# Patient Record
Sex: Male | Born: 1985 | Race: White | Hispanic: No | Marital: Married | Smoking: Former smoker
Health system: Southern US, Community
[De-identification: ages and names within clinical notes are randomized; demographics above are authoritative.]

## PROBLEM LIST (undated history)

## (undated) DIAGNOSIS — T8859XA Other complications of anesthesia, initial encounter: Secondary | ICD-10-CM

## (undated) DIAGNOSIS — K317 Polyp of stomach and duodenum: Secondary | ICD-10-CM

## (undated) DIAGNOSIS — K219 Gastro-esophageal reflux disease without esophagitis: Secondary | ICD-10-CM

## (undated) DIAGNOSIS — R519 Headache, unspecified: Secondary | ICD-10-CM

## (undated) DIAGNOSIS — D135 Benign neoplasm of extrahepatic bile ducts: Secondary | ICD-10-CM

## (undated) DIAGNOSIS — D4819 Other specified neoplasm of uncertain behavior of connective and other soft tissue: Secondary | ICD-10-CM

## (undated) DIAGNOSIS — D48118 Desmoid tumor of other site: Secondary | ICD-10-CM

## (undated) DIAGNOSIS — D481 Neoplasm of uncertain behavior of connective and other soft tissue: Secondary | ICD-10-CM

## (undated) DIAGNOSIS — R51 Headache: Secondary | ICD-10-CM

## (undated) DIAGNOSIS — Q8789 Other specified congenital malformation syndromes, not elsewhere classified: Secondary | ICD-10-CM

## (undated) DIAGNOSIS — D126 Benign neoplasm of colon, unspecified: Secondary | ICD-10-CM

## (undated) HISTORY — DX: Polyp of stomach and duodenum: K31.7

## (undated) HISTORY — DX: Benign neoplasm of extrahepatic bile ducts: D13.5

## (undated) HISTORY — PX: UPPER GASTROINTESTINAL ENDOSCOPY: SHX188

## (undated) HISTORY — PX: APPENDECTOMY: SHX54

## (undated) HISTORY — PX: COLECTOMY: SHX59

## (undated) HISTORY — DX: Neoplasm of uncertain behavior of connective and other soft tissue: D48.1

## (undated) HISTORY — DX: Headache: R51

## (undated) HISTORY — DX: Desmoid tumor of other site: D48.118

## (undated) HISTORY — DX: Headache, unspecified: R51.9

## (undated) HISTORY — DX: Other specified neoplasm of uncertain behavior of connective and other soft tissue: D48.19

## (undated) HISTORY — DX: Gastro-esophageal reflux disease without esophagitis: K21.9

## (undated) HISTORY — DX: Other specified congenital malformation syndromes, not elsewhere classified: Q87.89

## (undated) HISTORY — PX: COLONOSCOPY: SHX174

## (undated) HISTORY — DX: Benign neoplasm of colon, unspecified: D12.6

---

## 2014-08-18 ENCOUNTER — Encounter: Payer: Self-pay | Admitting: Family Medicine

## 2014-08-18 ENCOUNTER — Ambulatory Visit (INDEPENDENT_AMBULATORY_CARE_PROVIDER_SITE_OTHER): Payer: Managed Care, Other (non HMO) | Admitting: Family Medicine

## 2014-08-18 VITALS — BP 100/74 | HR 97 | Temp 98.3°F | Ht 75.25 in | Wt 264.3 lb

## 2014-08-18 DIAGNOSIS — F4541 Pain disorder exclusively related to psychological factors: Secondary | ICD-10-CM

## 2014-08-18 DIAGNOSIS — Q8789 Other specified congenital malformation syndromes, not elsewhere classified: Secondary | ICD-10-CM

## 2014-08-18 DIAGNOSIS — Z7689 Persons encountering health services in other specified circumstances: Secondary | ICD-10-CM

## 2014-08-18 DIAGNOSIS — Z7189 Other specified counseling: Secondary | ICD-10-CM

## 2014-08-18 DIAGNOSIS — F411 Generalized anxiety disorder: Secondary | ICD-10-CM

## 2014-08-18 DIAGNOSIS — Z23 Encounter for immunization: Secondary | ICD-10-CM

## 2014-08-18 DIAGNOSIS — D126 Benign neoplasm of colon, unspecified: Secondary | ICD-10-CM

## 2014-08-18 NOTE — Addendum Note (Signed)
Addended by: Agnes Lawrence on: 08/18/2014 05:10 PM   Modules accepted: Orders

## 2014-08-18 NOTE — Patient Instructions (Signed)
BEFORE YOU LEAVE: -schedule morning physical/follow up in 2-3 months - come fasting, drink plenty of water  -We placed a referral for you as discussed to the gastroenterologist. It usually takes about 1-2 weeks to process and schedule this referral. If you have not heard from [place] regarding this appointment in 2 weeks please contact our office.  We recommend the following healthy lifestyle measures: - eat a healthy diet consisting of lots of vegetables, fruits, beans, nuts, seeds, healthy meats such as white chicken and fish and whole grains.  - avoid fried foods, fast food, processed foods, sodas, red meet and other fattening foods.  - get a least 150 minutes of aerobic exercise per week.   Call to set up counseling

## 2014-08-18 NOTE — Progress Notes (Signed)
Pre visit review using our clinic review tool, if applicable. No additional management support is needed unless otherwise documented below in the visit note. 

## 2014-08-18 NOTE — Progress Notes (Signed)
HPI:  Jonathan Marsh is here to establish care. Wife sees me and advised he come in.  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 Gardner syndrome     Past Surgical History  Procedure Laterality Date  . Colectomy      age 29 due to cancerous polyps    Family History  Problem Relation Age of Onset  . Cancer Mother     GI cancer  . Drug abuse Mother   . Cancer Maternal Grandmother     lung  . Cancer Maternal Grandfather     colon ca    History   Social History  . Marital Status: Married    Spouse Name: N/A    Number of Children: N/A  . Years of Education: N/A   Social History Main Topics  . Smoking status: Former Research scientist (life sciences)  . Smokeless tobacco: None  . Alcohol Use: No  . Drug Use: None  . Sexual Activity: None   Other Topics Concern  . 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  EXAM:  Filed Vitals:   08/18/14 1620  BP: 100/74  Pulse: 97  Temp: 98.3 F (36.8 C)    Body mass index is 32.83 kg/(m^2).  GENERAL: vitals reviewed and listed above, alert, oriented, appears well hydrated and in no acute distress  HEENT: atraumatic, conjunttiva clear, no obvious abnormalities on inspection of external nose and ears  NECK: no obvious masses on inspection  LUNGS: clear to auscultation bilaterally, no wheezes, rales or rhonchi, good air movement  CV: HRRR, no peripheral edema  MS: moves all extremities without noticeable abnormality  PSYCH: pleasant and cooperative, no obvious depression or anxiety  NEURO: CN II-XII grossly intact, finger to nose normal, gait normal  ASSESSMENT AND PLAN:  Discussed the following assessment and plan:  Gardner's syndrome - Plan: Ambulatory referral to Gastroenterology -referral to GI, advised of importance of following up for this  GAD (generalized anxiety disorder) -discussed treatment options - meds, counseling, exercise -he opted to start with counseling and exercise and follow up in 2-3 months  Stress headaches -sounds most likely related to stress -advised should get labs and consider imaging  - he opted to hold of on imaging for now, do labs  at follow up and get imaging if HAs persist  Encounter to establish care -We reviewed the PMH, PSH, FH, SH, Meds and Allergies. -We provided refills for any medications we will prescribe as needed. -We addressed current concerns per orders and patient instructions. -We have asked for records for pertinent exams, studies, vaccines and notes from previous providers. -We have advised patient to follow up per instructions below.   -Patient advised to return or notify a doctor immediately if symptoms worsen or persist or new concerns arise.  Patient  Instructions  BEFORE YOU LEAVE: -schedule morning physical/follow up in 2-3 months - come fasting, drink plenty of water  -We placed a referral for you as discussed to the gastroenterologist. It usually takes about 1-2 weeks to process and schedule this referral. If you have not heard from [place] regarding this appointment in 2 weeks please contact our office.  We recommend the following healthy lifestyle measures: - eat a healthy diet consisting of lots of vegetables, fruits, beans, nuts, seeds, healthy meats such as white chicken and fish and whole grains.  - avoid fried foods, fast food, processed foods, sodas, red meet and other fattening foods.  - get a least 150 minutes of aerobic exercise per week.   Call to set up counseling      Rilei Kravitz, [facility] R.

## 2014-08-24 ENCOUNTER — Encounter: Payer: Self-pay | Admitting: Internal Medicine

## 2014-10-13 ENCOUNTER — Ambulatory Visit (INDEPENDENT_AMBULATORY_CARE_PROVIDER_SITE_OTHER): Payer: Managed Care, Other (non HMO) | Admitting: Internal Medicine

## 2014-10-13 ENCOUNTER — Encounter: Payer: Self-pay | Admitting: Internal Medicine

## 2014-10-13 VITALS — BP 120/66 | HR 68 | Ht 79.0 in | Wt 270.2 lb

## 2014-10-13 DIAGNOSIS — R634 Abnormal weight loss: Secondary | ICD-10-CM

## 2014-10-13 DIAGNOSIS — Q8789 Other specified congenital malformation syndromes, not elsewhere classified: Secondary | ICD-10-CM

## 2014-10-13 DIAGNOSIS — K219 Gastro-esophageal reflux disease without esophagitis: Secondary | ICD-10-CM

## 2014-10-13 DIAGNOSIS — D126 Benign neoplasm of colon, unspecified: Secondary | ICD-10-CM

## 2014-10-13 MED ORDER — RANITIDINE HCL 150 MG PO TABS: 150.0000 mg | ORAL_TABLET | Freq: Two times a day (BID) | ORAL | Status: DC

## 2014-10-13 NOTE — Patient Instructions (Signed)
We have sent the following medications to your pharmacy for you to pick up at your convenience:  Zantac  You have been scheduled for an endoscopy. Please follow written instructions given to you at your visit today. If you use inhalers (even only as needed), please bring them with you on the day of your procedure.

## 2014-10-13 NOTE — Progress Notes (Addendum)
HISTORY OF PRESENT ILLNESS:  Jonathan Marsh is a 29 y.o. male , originally from [place], who presents today to establish as a new patient because of a personal history of Gardner's syndrome. The patient reports that his paternal grandfather died of colon cancer at age 31. Subsequently, his mother was diagnosed with Gardner's syndrome. The patient was found to have Gardner's as well and underwent total colectomy at age 50, for multiple polyposis, at the [facility]. He denies being known to have any extraintestinal manifestations of the condition. He tells me that he last underwent upper endoscopy in [place] approximately 4 years ago and was found to have "benign polyps". He has had no evaluation since. His GI review of systems is remarkable for chronic and significant heartburn without dysphagia. He was advised not to use PPI. He is on no therapy. He also reports stress associated with decreased appetite and weight loss. He does have a chronic tendency toward loose bowels which is unchanged. No other GI complaints.  REVIEW OF SYSTEMS:  All non-GI ROS negative except for  Past Medical History  Diagnosis Date  . Gardner syndrome   . Headache     mild - stress related  . GERD (gastroesophageal reflux disease)     Past Surgical History  Procedure Laterality Date  . Colectomy      age 85 due to cancerous polyps  . Colonoscopy    . Upper gastrointestinal endoscopy      Social History Loretta Kluender  reports that he has quit smoking. He does not have any smokeless tobacco history on file. He reports that he does not drink alcohol. His drug history is not on file.  family history includes Cancer in his maternal grandfather, maternal grandmother, and mother; Drug abuse in his mother.  No Known Allergies     PHYSICAL EXAMINATION: Vital signs: BP 120/66 mmHg  Pulse 68  Ht 6\' 7"  (2.007 m)  Wt 270 lb 3.2 oz (122.562 kg)  BMI 30.43 kg/m2  Constitutional: generally well-appearing, no acute  distress Psychiatric: alert and oriented x3, cooperative Eyes: extraocular movements intact, anicteric, conjunctiva pink Mouth: oral pharynx moist, no lesions Neck: supple no lymphadenopathy. No obvious thyroid abnormality Cardiovascular: heart regular rate and rhythm, no murmur Lungs: clear to auscultation bilaterally Abdomen: soft, nontender, nondistended, no obvious ascites, no peritoneal signs, normal bowel sounds, no organomegaly. Prior surgical incision well-healed Rectal: Omitted Extremities: no lower extremity edema bilaterally Skin: no lesions on visible extremities Neuro: No focal deficits.   ASSESSMENT:  #1. Gardner syndrome status post total abdominal colectomy 25 years ago. I informed him that he is officially my first patient with the syndrome. In conjunction with his primary care physician, screening strategies as outlined below obtained from "up-to-date". #2. Chronic GERD. Significant symptoms. Untreated. #3. Decreased appetite and mild weight loss. Likely secondary to stress. Rule out organic process   PLAN:  #1. EGD with attention to the ampulla Vater. #2. Contrast-enhanced CT scan of the abdomen and pelvis #3. Primary care physician to screen for thyroid disease #4. Routine GI follow-up annually #5. Reflux precautions next #6. Prescribe and ranitidine 150 mg twice a day  SCREENING FOR EXTRAINTESTINAL MALIGNANCIES AND DESMOIDS - Consensus has not been achieved on optimal strategies for screening patients with Gardner syndrome (GS) for extraintestinal malignancies and desmoids. However, general screening recommendations for extracolonic malignancies have been suggested for patients with familial adenomatous polyposis (FAP) and should be applied to those with GS. (See "Familial adenomatous polyposis: Screening and management of patients  and families".) In addition: ?The thyroid should probably be subject to physical examination and ultrasound annually, starting at age  57 to 65 years. ?It is generally accepted that other possible extraintestinal cancer sites should be evaluated only if symptoms occur or if these cancers have occurred in relatives. This recommendation is provisional and not accepted by all investigators in this area. ?Patients should be questioned regularly regarding neurologic symptoms. In addition, a periodic head MRI is recommended if any family member has had CNS malignancy. The appropriate frequency is not known, but intervals of one to three years have been discussed, depending on the family history and presence of any possible related symptoms. ?Every six month liver palpation, together with liver function tests, abdominal ultrasound exam, and alpha fetoprotein levels, should be performed to screen for hepatoblastoma, especially during the first five years of life. This can be safely discontinued after age 57, and probably after age 61. The risk of occurrence of this tumor in neonates and children provides a rationale for performing genetic testing in the first year of life to determine which persons should have the screening. Although formal recommendations have not been given in this regard, genetic testing may be useful in patients if liver screening is to be considered. ?Biliary evaluation should be performed only for symptoms or abnormal liver function tests. Upper endoscopy every one to three years, with side viewing capability is indicated for screening the stomach and duodenum (as discussed in the section on FAP). Obvious papillary abnormalities will be observed with this screening. We perform annual liver function tests to screen for biliary tumors, although the sensitivity of this approach is unknown. Screening is not routinely undertaken for desmoids, but evaluation should be pursued for palpable masses or symptoms. On the other hand, it is reasonable to obtain an abdominal computed tomography (CT) with oral contrast every three years  beginning at age 25 to 7 [70]. Particular attention should also be given to the biliary tree, gallbladder, pancreas, small bowel and adrenal glands.  A copy of this consultation note will be sent to the patient's primary care provider, Dr. Maudie Mercury

## 2014-10-13 NOTE — Addendum Note (Signed)
Addended by: Agnes Lawrence on: 10/13/2014 02:15 PM   Modules accepted: Orders

## 2014-10-22 ENCOUNTER — Encounter: Payer: Managed Care, Other (non HMO) | Admitting: Family Medicine

## 2014-10-22 ENCOUNTER — Ambulatory Visit (AMBULATORY_SURGERY_CENTER): Payer: Managed Care, Other (non HMO) | Admitting: Internal Medicine

## 2014-10-22 ENCOUNTER — Encounter: Payer: Self-pay | Admitting: Internal Medicine

## 2014-10-22 VITALS — BP 109/53 | HR 54 | Temp 97.5°F | Resp 13 | Ht 79.0 in | Wt 270.0 lb

## 2014-10-22 DIAGNOSIS — K317 Polyp of stomach and duodenum: Secondary | ICD-10-CM

## 2014-10-22 DIAGNOSIS — D131 Benign neoplasm of stomach: Secondary | ICD-10-CM | POA: Diagnosis not present

## 2014-10-22 DIAGNOSIS — D135 Benign neoplasm of extrahepatic bile ducts: Secondary | ICD-10-CM | POA: Diagnosis not present

## 2014-10-22 DIAGNOSIS — K219 Gastro-esophageal reflux disease without esophagitis: Secondary | ICD-10-CM

## 2014-10-22 MED ORDER — SODIUM CHLORIDE 0.9 % IV SOLN: 500.0000 mL | INTRAVENOUS | Status: DC

## 2014-10-22 NOTE — Op Note (Signed)
[facility]  [address] [place], [address]   ENDOSCOPY PROCEDURE REPORT  PATIENT: Semir, Brill  MR#: [id number] BIRTHDATE: 01-25-1986 , 28  yrs. old GENDER: male ENDOSCOPIST: Eustace Quail, MD REFERRED BY:  Colin Benton, D.O. PROCEDURE DATE:  10/22/2014 PROCEDURE:  EGD w/ biopsy ASA CLASS:     Class II INDICATIONS:  screening for gastric / ampullary adenomatous. History of Gardner syndrome. MEDICATIONS: Monitored anesthesia care, Propofol 200 mg IV, and Lidocaine 200 mg IV TOPICAL ANESTHETIC: none DESCRIPTION OF PROCEDURE: After the risks benefits and alternatives of the procedure were thoroughly explained, informed consent was obtained.  The LB [id number] endoscope was introduced through the mouth and advanced to the second portion of the duodenum , Without limitations.  The instrument was slowly withdrawn as the mucosa was fully examined.  The esophagus was normal.  The stomach revealed innumerable small (5 mm or less) polyps in the proximal half.  Two 4 mm polyps, slightly red in the proximal antrum were biopsied.  No other gastric abnormalities.  The duodenal bulb and post bulbar duodenum were normal.  The major ampulla was slightly prominent, but likely within normal limits.  However biopsies taken to be certain. Retroflexed views revealed no abnormalities.     The scope was then withdrawn from the patient and the procedure completed. COMPLICATIONS: There were no immediate complications.  ENDOSCOPIC IMPRESSION: 1. Gastric polyps as described 2. Ampullary biopsies taken  RECOMMENDATIONS: 1.  Await biopsy results. Dr. Henrene Pastor Will send you a letter in a few weeks with the results 2.  My office will arrange for you to have a contrast-enhanced CT scan of the abdomen and pelvis "Gardner syndrome, surveillance for malignancy" 3.  Endoscopy  to be repeated in 2 years if biopsies negative  REPEAT EXAM:  eSigned:  Eustace Quail, MD 10/22/2014  12:14 PM    BB:[id number] Maudie Mercury, MD and The Patient

## 2014-10-22 NOTE — Progress Notes (Signed)
Called to room to assist during endoscopic procedure.  Patient ID and intended procedure confirmed with present staff. Received instructions for my participation in the procedure from the performing physician.  

## 2014-10-22 NOTE — Progress Notes (Signed)
Report to PACU, RN, vss, BBS= Clear.  

## 2014-10-22 NOTE — Patient Instructions (Signed)
YOU HAD AN ENDOSCOPIC PROCEDURE TODAY AT THE [facility]:   Refer to the procedure report that was given to you for any specific questions about what was found during the examination.  If the procedure report does not answer your questions, please call your gastroenterologist to clarify.  If you requested that your care partner not be given the details of your procedure findings, then the procedure report has been included in a sealed envelope for you to review at your convenience later.  YOU SHOULD EXPECT: Some feelings of bloating in the abdomen. Passage of more gas than usual.  Walking can help get rid of the air that was put into your GI tract during the procedure and reduce the bloating. If you had a lower endoscopy (such as a colonoscopy or flexible sigmoidoscopy) you may notice spotting of blood in your stool or on the toilet paper. If you underwent a bowel prep for your procedure, you may not have a normal bowel movement for a few days.  Please Note:  You might notice some irritation and congestion in your nose or some drainage.  This is from the oxygen used during your procedure.  There is no need for concern and it should clear up in a day or so.  SYMPTOMS TO REPORT IMMEDIATELY:    Following upper endoscopy (EGD)  Vomiting of blood or coffee ground material  New chest pain or pain under the shoulder blades  Painful or persistently difficult swallowing  New shortness of breath  Fever of 100F or higher  Black, tarry-looking stools  For urgent or emergent issues, a gastroenterologist can be reached at any hour by calling [phone].   DIET: Your first meal following the procedure should be a small meal and then it is ok to progress to your normal diet. Heavy or fried foods are harder to digest and may make you feel nauseous or bloated.  Likewise, meals heavy in dairy and vegetables can increase bloating.  Drink plenty of fluids but you should avoid alcoholic beverages  for 24 hours.  ACTIVITY:  You should plan to take it easy for the rest of today and you should NOT DRIVE or use heavy machinery until tomorrow (because of the sedation medicines used during the test).    FOLLOW UP: Our staff will call the number listed on your records the next business day following your procedure to check on you and address any questions or concerns that you may have regarding the information given to you following your procedure. If we do not reach you, we will leave a message.  However, if you are feeling well and you are not experiencing any problems, there is no need to return our call.  We will assume that you have returned to your regular daily activities without incident.  If any biopsies were taken you will be contacted by phone or by letter within the next 1-3 weeks.  Please call us at [phone] if you have not heard about the biopsies in 3 weeks.    SIGNATURES/CONFIDENTIALITY: You and/or your care partner have signed paperwork which will be entered into your electronic medical record.  These signatures attest to the fact that that the information above on your After Visit Summary has been reviewed and is understood.  Full responsibility of the confidentiality of this discharge information lies with you and/or your care-partner. 

## 2014-10-23 ENCOUNTER — Telehealth: Payer: Self-pay

## 2014-10-23 ENCOUNTER — Other Ambulatory Visit: Payer: Self-pay

## 2014-10-23 ENCOUNTER — Telehealth: Payer: Self-pay | Admitting: *Deleted

## 2014-10-23 DIAGNOSIS — D126 Benign neoplasm of colon, unspecified: Principal | ICD-10-CM

## 2014-10-23 DIAGNOSIS — Q8789 Other specified congenital malformation syndromes, not elsewhere classified: Secondary | ICD-10-CM

## 2014-10-23 NOTE — Telephone Encounter (Signed)
Pt scheduled for CT of A/P with contrast at [facility] CT 10/28/14@3pm . Pt to be NPO after 11am except bottle 1 of contrast at 1pm, second bottle at 2pm. Left message for pt to call back.

## 2014-10-23 NOTE — Telephone Encounter (Signed)
Spoke with pt and he is aware of appt. Pt given the number [phone] to call and reschedule the appt if this date does not work for him.

## 2014-10-23 NOTE — Telephone Encounter (Signed)
  Follow up Call-  Call back number 10/22/2014  Post procedure Call Back phone  # [phone]  Permission to leave phone message Yes     Patient questions:  Do you have a fever, pain , or abdominal swelling? No. Pain Score  0 *  Have you tolerated food without any problems? Yes.    Have you been able to return to your normal activities? No.  Do you have any questions about your discharge instructions: Diet   No. Medications  No. Follow up visit  No.  Do you have questions or concerns about your Care? No.  Actions: * If pain score is 4 or above: No action needed, pain <4.

## 2014-10-27 ENCOUNTER — Other Ambulatory Visit: Payer: Managed Care, Other (non HMO)

## 2014-10-28 ENCOUNTER — Ambulatory Visit (INDEPENDENT_AMBULATORY_CARE_PROVIDER_SITE_OTHER)
Admission: RE | Admit: 2014-10-28 | Discharge: 2014-10-28 | Disposition: A | Discharge status: Home / Self Care | Payer: Managed Care, Other (non HMO) | Source: Ambulatory Visit | Attending: Internal Medicine | Admitting: Internal Medicine

## 2014-10-28 DIAGNOSIS — D126 Benign neoplasm of colon, unspecified: Secondary | ICD-10-CM

## 2014-10-28 DIAGNOSIS — Q8789 Other specified congenital malformation syndromes, not elsewhere classified: Secondary | ICD-10-CM

## 2014-10-28 MED ORDER — IOHEXOL 300 MG/ML  SOLN
100.0000 mL | Freq: Once | INTRAMUSCULAR | Status: AC | PRN
  Administered 2014-10-28: 100 mL via INTRAVENOUS

## 2014-11-02 ENCOUNTER — Encounter: Payer: Managed Care, Other (non HMO) | Admitting: Family Medicine

## 2014-11-10 ENCOUNTER — Encounter: Payer: Self-pay | Admitting: Family Medicine

## 2014-11-10 ENCOUNTER — Ambulatory Visit (INDEPENDENT_AMBULATORY_CARE_PROVIDER_SITE_OTHER): Payer: Managed Care, Other (non HMO) | Admitting: Family Medicine

## 2014-11-10 VITALS — BP 102/66 | HR 84 | Temp 98.4°F | Ht 76.5 in | Wt 271.9 lb

## 2014-11-10 DIAGNOSIS — Z0000 Encounter for general adult medical examination without abnormal findings: Secondary | ICD-10-CM

## 2014-11-10 DIAGNOSIS — D126 Benign neoplasm of colon, unspecified: Secondary | ICD-10-CM | POA: Diagnosis not present

## 2014-11-10 DIAGNOSIS — F411 Generalized anxiety disorder: Secondary | ICD-10-CM

## 2014-11-10 DIAGNOSIS — Q8789 Other specified congenital malformation syndromes, not elsewhere classified: Secondary | ICD-10-CM

## 2014-11-10 LAB — COMPREHENSIVE METABOLIC PANEL
ALBUMIN: 4.4 g/dL (ref 3.5–5.2)
ALK PHOS: 59 U/L (ref 39–117)
ALT: 33 U/L (ref 0–53)
AST: 22 U/L (ref 0–37)
BILIRUBIN TOTAL: 0.7 mg/dL (ref 0.2–1.2)
BUN: 12 mg/dL (ref 6–23)
CALCIUM: 9.7 mg/dL (ref 8.4–10.5)
CO2: 29 mEq/L (ref 19–32)
Chloride: 105 mEq/L (ref 96–112)
Creatinine, Ser: 1.09 mg/dL (ref 0.40–1.50)
GFR: 84.72 mL/min (ref >60.00)
Glucose, Bld: 91 mg/dL (ref 70–99)
POTASSIUM: 4.3 meq/L (ref 3.5–5.1)
Sodium: 139 mEq/L (ref 135–145)
Total Protein: 7.2 g/dL (ref 6.0–8.3)

## 2014-11-10 LAB — HEMOGLOBIN A1C: Hgb A1c MFr Bld: 5.2 % (ref 4.6–6.5)

## 2014-11-10 LAB — LIPID PANEL
CHOLESTEROL: 204 mg/dL — AB (ref 0–200)
HDL: 49.2 mg/dL (ref >39.00)
LDL Cholesterol: 133 mg/dL — ABNORMAL HIGH (ref 0–99)
NonHDL: 154.8
Total CHOL/HDL Ratio: 4
Triglycerides: 108 mg/dL (ref 0.0–149.0)
VLDL: 21.6 mg/dL (ref 0.0–40.0)

## 2014-11-10 NOTE — Progress Notes (Signed)
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 Henrene Pastor here - appreciate care; pt reports Dr. Henrene Pastor is working in consultation with a specialist in [facility] -no [place] CNS malignancy on mother's side -denies: any new HA - reports his headaches have resolved for the most part and he is not interested in doing an MRI, vision changes, gait abnormalities, fever, sig changes in weight that are unintentional, masses -he canceled his [place] of his neck - he did not know why he needed this  Stress: -reports this has improved -working of a balance -denies: SI, thoughts of self harm, worsening or frequent has, cog changes  -Diet: reports diet is poor - trying to watch out for "really bad food" the last few weeks  -Exercise: has started to jog/run twice per week with his step son  -Diabetes and Dyslipidemia Screening: FASTING today for labs  -Hx of HTN: no  -Vaccines: UTD per his report  -sexual activity: yes, male partner, no new partners  -wants STI testing, Hep C screening (if born 14-1966): opted to do HIV, but not concerned  -Alcohol, Tobacco, drug use: see social history  Review of Systems - no fevers, unintentional weight loss, vision loss, hearing loss, chest pain, sob, hemoptysis, melena, hematochezia, hematuria, genital discharge, changing or concerning skin lesions, bleeding, bruising, loc, thoughts of self harm or SI  Past Medical History  Diagnosis Date  . Gardner syndrome   . Headache     mild - stress related  . GERD (gastroesophageal reflux disease)     Past Surgical History  Procedure Laterality Date  . Colectomy      age 29 due to cancerous polyps  . Colonoscopy    . Upper gastrointestinal endoscopy      Family History  Problem Relation Age of Onset  . Cancer Mother     GI cancer  . Drug abuse Mother   . Cancer Maternal Grandmother     lung  . Cancer Maternal Grandfather     colon ca    History    Social History  . Marital Status: Married    Spouse Name: N/A  . Number of Children: N/A  . Years of Education: N/A   Social History Main Topics  . Smoking status: Former Research scientist (life sciences)  . Smokeless tobacco: Not on file  . Alcohol Use: No  . Drug Use: Not on file  . Sexual Activity: Not on file   Other Topics Concern  . 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  ranitidine (ZANTAC) 150 MG tablet, Take 1 tablet (150 mg total) by mouth 2 (two) times daily., Disp: 60 tablet, Rfl: 6  EXAM:  Filed Vitals:   11/10/14 0953  BP: 102/66  Pulse: 84  Temp: 98.4 F (36.9 C)  TempSrc: Oral  Height: 6' 4.5" (1.943 m)  Weight: 271 lb 14.4 oz (123.333 kg)    Estimated body mass index is 32.67 kg/(m^2) as calculated from the following:   Height as of this encounter: 6' 4.5" (1.943 m).   Weight as of this encounter: 271 lb 14.4 oz (123.333 kg).  GENERAL: vitals reviewed  and listed below, alert, oriented, appears well hydrated and in no acute distress  HEENT: head atraumatic, PERRLA, normal appearance of eyes, ears, nose and mouth. moist mucus membranes.  NECK: supple, no masses or lymphadenopathy  LUNGS: clear to auscultation bilaterally, no rales, rhonchi or wheeze  CV: HRRR, no peripheral edema or cyanosis, normal pedal pulses  ABDOMEN: bowel sounds normal, soft, non tender to palpation, no masses, no rebound or guarding  GU: normal appearance of external genitalia - no lesions or masses, hernia exam normal.  RECTAL: refused  SKIN: no rash or abnormal lesions  MS: normal gait, moves all extremities normally  NEURO: CN II-XII grossly intact, normal muscle strength and sensation to light touch on extremities  PSYCH: normal affect, pleasant and cooperative  ASSESSMENT  AND PLAN:  Discussed the following assessment and plan:  Visit for preventive health examination - Plan: US Soft Tissue Head/Neck, CMP, Lipid Panel, Hemoglobin A1c, HIV antibody (with reflex)  Gardner's syndrome  GAD (generalized anxiety disorder)  -Discussed and advised all [place] preventive services health task force level A and B recommendations for age, sex and risks.  -Advised at least 150 minutes of exercise per week and a healthy diet low in saturated fats and sweets and consisting of fresh fruits and vegetables, lean meats such as fish and white chicken and whole grains.  -FASTING labs, studies and vaccines per orders this encounter  -Thyroid US for Gardner's - he cancelled this, advised to reschedule and reordered  -his HAs have sig improved, no other neurologically complaints, advised of increased risks of CNS malignancy and advised MRI - he declined this for now, advised if headaches recur should definitively pursue this  Patient advised to return to clinic immediately if symptoms worsen or persist or new concerns.  There are no Patient Instructions on file for this visit.  Return in about 6 months (around 05/13/2015), or if symptoms worsen or fail to improve, for follow up.   Colin Benton R.

## 2014-11-10 NOTE — Progress Notes (Signed)
Pre visit review using our clinic review tool, if applicable. No additional management support is needed unless otherwise documented below in the visit note. 

## 2014-11-10 NOTE — Patient Instructions (Signed)
BEFORE YOU LEAVE: -labs  Please get the [place] of your neck that we re-ordered for you - call our office if any difficulty scheduling this.  Follow up in 6 months and as needed and any time you have new or changing symptoms or concerns regarding your health  We recommend the following healthy lifestyle measures: - eat a healthy diet consisting of lots of vegetables, fruits, beans, nuts, seeds, healthy meats such as white chicken and fish and whole grains.  - avoid fried foods, fast food, processed foods, sodas, red meet and other fattening foods.  - get a least 150 minutes of aerobic exercise per week.

## 2014-11-11 ENCOUNTER — Encounter: Payer: Self-pay | Admitting: *Deleted

## 2014-11-11 LAB — HIV ANTIBODY (ROUTINE TESTING W REFLEX): HIV 1&2 Ab, 4th Generation: NONREACTIVE

## 2014-11-11 NOTE — Progress Notes (Signed)
I spoke with patient about results, but will send letter at a later date. Thank you for asking and following up. Dr. Henrene Pastor     ----- Message -----     From: Levonne Spiller, RN     Sent: 11/11/2014  7:25 AM      To: Irene Shipper, MD        Do you want patient to have result letter? Any recall EGD? Thanks, [employer]

## 2014-11-23 ENCOUNTER — Other Ambulatory Visit: Payer: Managed Care, Other (non HMO)

## 2014-11-30 ENCOUNTER — Other Ambulatory Visit: Payer: Managed Care, Other (non HMO)

## 2014-12-07 ENCOUNTER — Other Ambulatory Visit: Payer: Managed Care, Other (non HMO)

## 2014-12-14 ENCOUNTER — Other Ambulatory Visit: Payer: Managed Care, Other (non HMO)

## 2014-12-24 ENCOUNTER — Other Ambulatory Visit: Payer: Managed Care, Other (non HMO)

## 2014-12-29 ENCOUNTER — Other Ambulatory Visit: Payer: Managed Care, Other (non HMO)

## 2015-01-04 ENCOUNTER — Other Ambulatory Visit: Payer: Managed Care, Other (non HMO)

## 2015-03-03 ENCOUNTER — Telehealth: Payer: Self-pay

## 2015-03-03 ENCOUNTER — Other Ambulatory Visit: Payer: Self-pay

## 2015-03-03 DIAGNOSIS — D369 Benign neoplasm, unspecified site: Secondary | ICD-10-CM

## 2015-03-03 NOTE — Telephone Encounter (Signed)
-----   Message from Irene Shipper, MD sent at 03/02/2015 12:11 PM EDT ----- Regarding: Evaluate ampulla with side-viewing scope Vaughan Basta,  Please contact the patient. I have discussed his case with colleagues. At this time I would like to reevaluate his ampulla with adenoma using a side-viewing (ERCP) scope. This will help determine the next step moving forward. This will need to be scheduled at Hospital (we do not have that special scope in the [facility]). I have a morning block on Tuesday, August 16, if that works for him. Thanks Dr. Henrene Pastor

## 2015-03-03 NOTE — Telephone Encounter (Signed)
Pt scheduled for EGD with ERCP scope at [facility] 03/30/15@11 :30am, pt to arrive there at 10am. Pt to be NPO after midnight. Pt aware of appt.

## 2015-03-25 ENCOUNTER — Encounter (HOSPITAL_COMMUNITY): Payer: Self-pay | Admitting: *Deleted

## 2015-03-30 ENCOUNTER — Encounter (HOSPITAL_COMMUNITY): Payer: Self-pay

## 2015-03-30 ENCOUNTER — Ambulatory Visit (HOSPITAL_COMMUNITY): Payer: Managed Care, Other (non HMO) | Admitting: Anesthesiology

## 2015-03-30 ENCOUNTER — Encounter (HOSPITAL_COMMUNITY)
Admission: RE | Disposition: A | Discharge status: Home / Self Care | Payer: Self-pay | Source: Ambulatory Visit | Attending: Internal Medicine

## 2015-03-30 ENCOUNTER — Ambulatory Visit (HOSPITAL_COMMUNITY)
Admission: RE | Admit: 2015-03-30 | Discharge: 2015-03-30 | Disposition: A | Discharge status: Home / Self Care | Payer: Managed Care, Other (non HMO) | Source: Ambulatory Visit | Attending: Internal Medicine | Admitting: Internal Medicine

## 2015-03-30 DIAGNOSIS — K317 Polyp of stomach and duodenum: Secondary | ICD-10-CM | POA: Diagnosis not present

## 2015-03-30 DIAGNOSIS — Z8719 Personal history of other diseases of the digestive system: Secondary | ICD-10-CM | POA: Insufficient documentation

## 2015-03-30 DIAGNOSIS — Z9049 Acquired absence of other specified parts of digestive tract: Secondary | ICD-10-CM | POA: Insufficient documentation

## 2015-03-30 DIAGNOSIS — Z87891 Personal history of nicotine dependence: Secondary | ICD-10-CM | POA: Insufficient documentation

## 2015-03-30 DIAGNOSIS — Z809 Family history of malignant neoplasm, unspecified: Secondary | ICD-10-CM | POA: Diagnosis not present

## 2015-03-30 DIAGNOSIS — D369 Benign neoplasm, unspecified site: Secondary | ICD-10-CM

## 2015-03-30 DIAGNOSIS — C241 Malignant neoplasm of ampulla of Vater: Secondary | ICD-10-CM | POA: Diagnosis not present

## 2015-03-30 DIAGNOSIS — K219 Gastro-esophageal reflux disease without esophagitis: Secondary | ICD-10-CM | POA: Insufficient documentation

## 2015-03-30 DIAGNOSIS — Z86010 Personal history of colonic polyps: Secondary | ICD-10-CM | POA: Diagnosis present

## 2015-03-30 DIAGNOSIS — D135 Benign neoplasm of extrahepatic bile ducts: Secondary | ICD-10-CM

## 2015-03-30 HISTORY — PX: ESOPHAGOGASTRODUODENOSCOPY: SHX5428

## 2015-03-30 SURGERY — EGD (ESOPHAGOGASTRODUODENOSCOPY)
Anesthesia: Monitor Anesthesia Care

## 2015-03-30 MED ORDER — ONDANSETRON HCL 4 MG/2ML IJ SOLN
INTRAMUSCULAR | Status: DC | PRN
  Administered 2015-03-30: 4 mg via INTRAVENOUS

## 2015-03-30 MED ORDER — PROPOFOL 10 MG/ML IV BOLUS
INTRAVENOUS | Status: AC
  Filled 2015-03-30: qty 20

## 2015-03-30 MED ORDER — BUTAMBEN-TETRACAINE-BENZOCAINE 2-2-14 % EX AERO
INHALATION_SPRAY | CUTANEOUS | Status: DC | PRN
  Administered 2015-03-30: 2 via TOPICAL

## 2015-03-30 MED ORDER — LACTATED RINGERS IV SOLN
INTRAVENOUS | Status: DC
  Administered 2015-03-30: 1000 mL via INTRAVENOUS

## 2015-03-30 MED ORDER — SODIUM CHLORIDE 0.9 % IV SOLN: INTRAVENOUS | Status: DC

## 2015-03-30 MED ORDER — LIDOCAINE HCL (CARDIAC) 20 MG/ML IV SOLN
INTRAVENOUS | Status: AC
  Filled 2015-03-30: qty 5

## 2015-03-30 MED ORDER — LIDOCAINE HCL (CARDIAC) 20 MG/ML IV SOLN
INTRAVENOUS | Status: DC | PRN
  Administered 2015-03-30: 100 mg via INTRAVENOUS

## 2015-03-30 MED ORDER — ONDANSETRON HCL 4 MG/2ML IJ SOLN
INTRAMUSCULAR | Status: AC
  Filled 2015-03-30: qty 2

## 2015-03-30 MED ORDER — PROPOFOL 10 MG/ML IV BOLUS
INTRAVENOUS | Status: DC | PRN
  Administered 2015-03-30 (×6): 50 mg via INTRAVENOUS

## 2015-03-30 NOTE — Transfer of Care (Signed)
Immediate Anesthesia Transfer of Care Note  Patient: Jonathan Marsh  Procedure(s) Performed: Procedure(s) with comments: ESOPHAGOGASTRODUODENOSCOPY (EGD) (N/A) - need side viewing scope  Patient Location: PACU  Anesthesia Type:MAC  Level of Consciousness: sedated  Airway & Oxygen Therapy: Patient Spontanous Breathing and Patient connected to nasal cannula oxygen  Post-op Assessment: Report given to RN and Post -op Vital signs reviewed and stable  Post vital signs: Reviewed and stable  Last Vitals:  Filed Vitals:   03/30/15 1046  BP: 113/79  Pulse: 69  Temp: 36.7 C  Resp: 15    Complications: No apparent anesthesia complications

## 2015-03-30 NOTE — Anesthesia Preprocedure Evaluation (Signed)
Anesthesia Evaluation  Patient identified by MRN, date of birth, ID band Patient awake    Reviewed: Allergy & Precautions, NPO status , Patient's Chart, lab work & pertinent test results  Airway Mallampati: I  TM Distance: >3 FB Neck ROM: Full    Dental no notable dental hx.    Pulmonary neg pulmonary ROS, former smoker,  breath sounds clear to auscultation  Pulmonary exam normal       Cardiovascular negative cardio ROS Normal cardiovascular examRhythm:Regular Rate:Normal     Neuro/Psych negative neurological ROS  negative psych ROS   GI/Hepatic negative GI ROS, Neg liver ROS,   Endo/Other  negative endocrine ROS  Renal/GU negative Renal ROS  negative genitourinary   Musculoskeletal negative musculoskeletal ROS (+)   Abdominal   Peds negative pediatric ROS (+)  Hematology negative hematology ROS (+)   Anesthesia Other Findings   Reproductive/Obstetrics negative OB ROS                             Anesthesia Physical Anesthesia Plan  ASA: II  Anesthesia Plan: MAC   Post-op Pain Management:    Induction:   Airway Management Planned: Natural Airway  Additional Equipment:   Intra-op Plan:   Post-operative Plan:   Informed Consent: I have reviewed the patients History and Physical, chart, labs and discussed the procedure including the risks, benefits and alternatives for the proposed anesthesia with the patient or authorized representative who has indicated his/her understanding and acceptance.   Dental advisory given  Plan Discussed with: CRNA  Anesthesia Plan Comments:         Anesthesia Quick Evaluation

## 2015-03-30 NOTE — Op Note (Signed)
[facility] [place], [address]   ENDOSCOPY PROCEDURE REPORT  PATIENT: Jonathan Marsh, Jonathan Marsh  MR#: [id number] BIRTHDATE: [birth year] , 28  yrs. old GENDER: male ENDOSCOPIST: Eustace Quail, MD REFERRED BY:  Surveillance - Recall PROCEDURE DATE:  03/30/2015 PROCEDURE:  EGD w/ biopsy ASA CLASS:     Class II INDICATIONS:  diagnostic procedure.Marland Kitchen History of Gardner syndrome. Upper endoscopy March 2016 with multiple diminutive gastric polyps. Several biopsies returned adenomatous. Question prominent ampulla without gross mass. Biopsies reveal adenoma. Now for more formal assessment of the ampulla and periampullary region with side-viewing scope MEDICATIONS: Monitored anesthesia care and Per Anesthesia TOPICAL ANESTHETIC: none  DESCRIPTION OF PROCEDURE: After the risks benefits and alternatives of the procedure were thoroughly explained, informed consent was obtained.  The pentax ercp [id number] endoscope was introduced through the mouth and advanced to the second portion of the duodenum , Without limitations.  The instrument was slowly withdrawn as the mucosa was fully examined.    The esophagus was blindly intubated.  The stomach revealed innumerable small (5 mm or less) polyps in the proximal stomach. The antrum was normal.  The duodenal bulb was normal.  The post bulbar region revealed 1 diminutive polyp which was not removed. The ampulla was visualized and imaged. The ampulla proper was estimated to measure 1 cm.  The inferior portion appeared to have adenomatous tissue emanating. This was estimated to measure approximately 9 mm.  Multiple biopsies taken.  Retroflexed views revealed no abnormalities.     The scope was then withdrawn from the patient and the procedure completed.  COMPLICATIONS: There were no immediate complications.  ENDOSCOPIC IMPRESSION: 1. History of Gardner's syndrome 2. Ampullary/periampullary adenoma as described 3. Multiple small  gastric polyps  RECOMMENDATIONS: 1. Await biopsy results . Further management recommendations to follow  REPEAT EXAM:  eSigned:  Eustace Quail, MD 03/30/2015 11:55 AM    CC:The Patient and Colin Benton, MD

## 2015-03-30 NOTE — Anesthesia Postprocedure Evaluation (Signed)
  Anesthesia Post-op Note  Patient: Jonathan Marsh  Procedure(s) Performed: Procedure(s) (LRB): ESOPHAGOGASTRODUODENOSCOPY (EGD) (N/A)  Patient Location: PACU  Anesthesia Type: MAC  Level of Consciousness: awake and alert   Airway and Oxygen Therapy: Patient Spontanous Breathing  Post-op Pain: mild  Post-op Assessment: Post-op Vital signs reviewed, Patient's Cardiovascular Status Stable, Respiratory Function Stable, Patent Airway and No signs of Nausea or vomiting  Last Vitals:  Filed Vitals:   03/30/15 1210  BP:   Pulse: 61  Temp:   Resp: 15    Post-op Vital Signs: stable   Complications: No apparent anesthesia complications

## 2015-03-30 NOTE — H&P (Signed)
  HISTORY OF PRESENT ILLNESS:  Jonathan Marsh is a 29 y.o. male with history of Gardner syndrome for which he is status post remote colectomy. Underwent surveillance upper endoscopy 10/22/2014. Found to have multiple small polyps in the stomach. Several biopsied and returned adenomatous. Also somewhat prominent (probable) ampulla was biopsied and returned adenomatous. Now for more thorough evaluation of the ampullary region with biopsies. Subsequently may be considered for ampullectomy if appropriate. He is accompanied today by his wife  REVIEW OF SYSTEMS:  All non-GI ROS negative except for  Past Medical History  Diagnosis Date  . Gardner syndrome   . GERD (gastroesophageal reflux disease)   . Headache     mild - stress related    Past Surgical History  Procedure Laterality Date  . Colectomy      age 50 due to cancerous polyps  . Colonoscopy    . Upper gastrointestinal endoscopy      Social History [name]  reports that he has quit smoking. He has never used smokeless tobacco. He reports that he does not drink alcohol or use illicit drugs.  family history includes Cancer in his maternal grandfather, maternal grandmother, and mother; Drug abuse in his mother.  No Known Allergies     PHYSICAL EXAMINATION: Vital signs: BP 113/79 mmHg  Pulse 69  Temp(Src) 98.1 F (36.7 C) (Oral)  Resp 15  Ht 6' 4.5" (1.943 m)  Wt 271 lb (122.925 kg)  BMI 32.56 kg/m2  SpO2 98% General: Well-developed, well-nourished, no acute distress HEENT: Sclerae are anicteric, conjunctiva pink. Oral mucosa intact Lungs: Clear Heart: Regular Abdomen: soft, nontender, nondistended, no obvious ascites, no peritoneal signs, normal bowel sounds. No organomegaly. Extremities: No edema Psychiatric: alert and oriented x3. Cooperative     ASSESSMENT:  #1. Gardner syndrome status post remote colectomy #2. 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 Upper  endoscopy with side-viewing scope and biopsies.The nature of the procedure, as well as the risks, benefits, and alternatives were carefully and thoroughly reviewed with the patient. Ample time for discussion and questions allowed. The patient understood, was satisfied, and agreed to proceed.

## 2015-03-30 NOTE — Discharge Instructions (Signed)
Esophagogastroduodenoscopy °Care After °Refer to this sheet in the next few weeks. These instructions provide you with information on caring for yourself after your procedure. Your caregiver may also give you more specific instructions. Your treatment has been planned according to current medical practices, but problems sometimes occur. Call your caregiver if you have any problems or questions after your procedure.  °HOME CARE INSTRUCTIONS °· Do not eat or drink anything until the numbing medicine (local anesthetic) has worn off and your gag reflex has returned. You will know that the local anesthetic has worn off when you can swallow comfortably. °· Do not drive for 12 hours after the procedure or as directed by your caregiver. °· Only take medicines as directed by your caregiver. °SEEK MEDICAL CARE IF:  °· You cannot stop coughing. °· You are not urinating at all or less than usual. °SEEK IMMEDIATE MEDICAL CARE IF: °· You have difficulty swallowing. °· You cannot eat or drink. °· You have worsening throat or chest pain. °· You have dizziness, lightheadedness, or you faint. °· You have nausea or vomiting. °· You have chills. °· You have a fever. °· You have severe abdominal pain. °· You have black, tarry, or bloody stools. °Document Released: 07/17/2012 Document Reviewed: 07/17/2012 °ExitCare® Patient Information ©2015 [employer]. This information is not intended to replace advice given to you by your health care provider. Make sure you discuss any questions you have with your health care provider. ° ° °Conscious Sedation, Adult, Care After °Refer to this sheet in the next few weeks. These instructions provide you with information on caring for yourself after your procedure. Your health care provider may also give you more specific instructions. Your treatment has been planned according to current medical practices, but problems sometimes occur. Call your health care provider if you have any problems or  questions after your procedure. °WHAT TO EXPECT AFTER THE PROCEDURE  °After your procedure: °· You may feel sleepy, clumsy, and have poor balance for several hours. °· Vomiting may occur if you eat too soon after the procedure. °HOME CARE INSTRUCTIONS °· Do not participate in any activities where you could become injured for at least 24 hours. Do not: °¨ Drive. °¨ Swim. °¨ Ride a bicycle. °¨ Operate heavy machinery. °¨ Cook. °¨ Use power tools. °¨ Climb ladders. °¨ Work from a high place. °· Do not make important decisions or sign legal documents until you are improved. °· If you vomit, drink water, juice, or soup when you can drink without vomiting. Make sure you have little or no nausea before eating solid foods. °· Only take over-the-counter or prescription medicines for pain, discomfort, or fever as directed by your health care provider. °· Make sure you and your family fully understand everything about the medicines given to you, including what side effects may occur. °· You should not drink alcohol, take sleeping pills, or take medicines that cause drowsiness for at least 24 hours. °· If you smoke, do not smoke without supervision. °· If you are feeling better, you may resume normal activities 24 hours after you were sedated. °· Keep all appointments with your health care provider. °SEEK MEDICAL CARE IF: °· Your skin is pale or bluish in color. °· You continue to feel nauseous or vomit. °· Your pain is getting worse and is not helped by medicine. °· You have bleeding or swelling. °· You are still sleepy or feeling clumsy after 24 hours. °SEEK IMMEDIATE MEDICAL CARE IF: °· You develop a   rash. °· You have difficulty breathing. °· You develop any type of allergic problem. °· You have a fever. °MAKE SURE YOU: °· Understand these instructions. °· Will watch your condition. °· Will get help right away if you are not doing well or get worse. °Document Released: 05/21/2013 Document Reviewed: 05/21/2013 °ExitCare®  Patient Information ©2015 [employer]. This information is not intended to replace advice given to you by your health care provider. Make sure you discuss any questions you have with your health care provider. ° °

## 2015-03-31 ENCOUNTER — Encounter (HOSPITAL_COMMUNITY): Payer: Self-pay | Admitting: Internal Medicine

## 2015-04-06 ENCOUNTER — Encounter: Payer: Self-pay | Admitting: Internal Medicine

## 2015-04-07 ENCOUNTER — Telehealth: Payer: Self-pay

## 2015-04-07 NOTE — Telephone Encounter (Signed)
Jonathan Marsh called at [facility] to see if records were received. Left message for pt to call back.  [facility] does have records and they are waiting for Dr. Lysle Rubens to review the records.

## 2015-04-07 NOTE — Telephone Encounter (Signed)
-----   Message from Irene Shipper, MD sent at 04/06/2015  3:57 PM EDT ----- Regarding: Appointment with Dr. Gayleen Orem at [facility], I spoke with Dr. Gayleen Orem regarding Marisa Cyphers. 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 Please send Dr. Lysle Rubens my only office note, 2 endoscopy reports, and 2 pathology reports. He told me that his office will contact the patient to set up an appointment. Please provide the patient's contact information. The fax number for Dr. Gayleen Orem at [facility] is [phone]. I believe their office number is [phone]. Please confirm that the information has been received. Thank you. Convert this to phone note for the record. Dr. Henrene Pastor

## 2015-04-07 NOTE — Telephone Encounter (Signed)
Records faxed to Dr. Glenna Durand office.

## 2015-05-26 ENCOUNTER — Telehealth: Payer: Self-pay

## 2015-05-26 NOTE — Telephone Encounter (Signed)
Pt is scheduled to have procedure with Dr. Lysle Rubens 06/10/15.

## 2015-06-14 ENCOUNTER — Telehealth: Payer: Self-pay | Admitting: Internal Medicine

## 2015-06-14 NOTE — Telephone Encounter (Signed)
Pt had procedure at [facility] last week. Over the weekend pt started having abdominal pain and was admitted to [facility]. States there were 3 gas pockets outside his small intestine, small perf where the polyp was removed near the ampulla. Pt states they are trying not to do surgery. He is NPO and on IV antibiotics. Pt states he was not very happy about his experience at [facility]. Pt wanted to keep Dr. Henrene Pastor informed of what was going on . Dr. Henrene Pastor notified.

## 2015-06-15 NOTE — Telephone Encounter (Signed)
I spoke with Jonathan Marsh yesterday and tonight, as well as Dr. Lysle Rubens today (who also spoke with Jonathan Marsh). Jonathan Marsh's doing much better clinically and radiographically. I asked him to call me for questions or problems. Otherwise, LINDA, please make follow up EGD (with ERCP scope at hospital) in 6 months.

## 2015-06-16 NOTE — Telephone Encounter (Signed)
Reminder placed in epic for EGD with ERCP scope for May at the hospital.

## 2015-06-23 ENCOUNTER — Encounter: Payer: Self-pay | Admitting: Internal Medicine

## 2015-06-25 ENCOUNTER — Telehealth: Payer: Self-pay | Admitting: Family Medicine

## 2015-06-25 NOTE — Telephone Encounter (Signed)
Pt called to schedule hospital follow up appt for Monday.  Pt was in [facility] and has FMLA / returning to work date 11/16  if all goes well at appointment. Dr Maudie Mercury pt.

## 2015-06-28 ENCOUNTER — Ambulatory Visit (INDEPENDENT_AMBULATORY_CARE_PROVIDER_SITE_OTHER): Payer: Managed Care, Other (non HMO) | Admitting: Family Medicine

## 2015-06-28 ENCOUNTER — Telehealth: Payer: Self-pay

## 2015-06-28 ENCOUNTER — Encounter: Payer: Self-pay | Admitting: Family Medicine

## 2015-06-28 VITALS — BP 100/68 | HR 89 | Temp 98.5°F | Ht 76.5 in | Wt 262.6 lb

## 2015-06-28 DIAGNOSIS — R1013 Epigastric pain: Secondary | ICD-10-CM

## 2015-06-28 DIAGNOSIS — D126 Benign neoplasm of colon, unspecified: Secondary | ICD-10-CM | POA: Diagnosis not present

## 2015-06-28 DIAGNOSIS — Q8789 Other specified congenital malformation syndromes, not elsewhere classified: Secondary | ICD-10-CM

## 2015-06-28 MED ORDER — OMEPRAZOLE 40 MG PO CPDR: 40.0000 mg | DELAYED_RELEASE_CAPSULE | Freq: Every day | ORAL | Status: DC

## 2015-06-28 NOTE — Telephone Encounter (Signed)
Have him start omeprazole 40 mg daily and see me this Thursday (overbook at 1:15). Also, get copies of CT reports and D/C summary from [facility]. Thanks

## 2015-06-28 NOTE — Telephone Encounter (Signed)
Please see if patient qualifies for TCM. If so, please make call.

## 2015-06-28 NOTE — Progress Notes (Signed)
Pre visit review using our clinic review tool, if applicable. No additional management support is needed unless otherwise documented below in the visit note. 

## 2015-06-28 NOTE — Addendum Note (Signed)
Addended by: Rosanne Sack R on: 06/28/2015 04:53 PM   Modules accepted: Orders

## 2015-06-28 NOTE — Telephone Encounter (Signed)
Pt was seen by Dr. Maudie Mercury at [facility] today for abdominal pain.Pt was recently discharged from [facility] and pt states [facility] had him see Dr. Maudie Mercury. Dr. Maudie Mercury would like pt see by Dr. Henrene Pastor soon. Please advise.

## 2015-06-28 NOTE — Progress Notes (Signed)
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  Past Medical History  Diagnosis Date  . Gardner syndrome   . GERD (gastroesophageal reflux disease)   . Headache     mild - stress related    Past Surgical History  Procedure Laterality Date  . Colectomy      age 32 due to cancerous polyps  . Colonoscopy    . Upper gastrointestinal endoscopy    . Esophagogastroduodenoscopy N/A 03/30/2015    Procedure: ESOPHAGOGASTRODUODENOSCOPY (EGD);  Surgeon: Irene Shipper, MD;  Location: [facility];  Service: Endoscopy;  Laterality: N/A;  need side viewing scope    Family History  Problem Relation Age of Onset  . Cancer Mother     GI cancer  . Drug abuse Mother   . Cancer Maternal Grandmother     lung  . Cancer Maternal Grandfather     colon ca    Social History   Social History  . Marital Status: Married    Spouse Name: N/A  . Number of Children: N/A  . Years of  Education: N/A   Social History Main Topics  . Smoking status: Former Research scientist (life sciences)  . Smokeless tobacco: Never Used  . Alcohol Use: No  . Drug Use: No  . Sexual Activity: Not Asked   Other Topics Concern  . 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  EXAM:  Filed Vitals:   06/28/15 1454  BP: 100/68  Pulse: 89  Temp: 98.5 F (36.9 C)    Body mass index is 31.55 kg/(m^2).  GENERAL: vitals reviewed and listed above, alert, oriented, appears well hydrated and in no acute distress  HEENT: atraumatic, conjunttiva clear, no obvious abnormalities on inspection of external nose and ears  NECK: no obvious masses on inspection  LUNGS: clear to auscultation bilaterally, no wheezes, rales or rhonchi, good air movement  CV: HRRR, no peripheral edema  ABD: old surgical scars, TTP in epigastric region without rebound or guarding  MS: moves all extremities without noticeable abnormality  PSYCH: pleasant and cooperative, no obvious depression or anxiety  ASSESSMENT AND PLAN:  Discussed the following assessment and plan:  Gardner's syndrome  Abdominal pain, epigastric  -  complicated GI history, follow up with GI advised - suspect pain combination of gastroenteritis and post procedural but needs to see his gastroenterologist for this, return to work recs and for further eval/follow up RLQ lesion, my assistant is to call GI for appt, return and ED precuations -notified pt needs his thyroid US, he plans to address once feeling better - asked my scheduler to reschedule in 1 month per pt preference -Patient advised to return or notify a doctor immediately if symptoms worsen or persist or new concerns arise.  Addendum: my assistant contacted his GI office and was told they will  call pt tomorrow with appointment information. This message was relayed to the pt. Advised pt to contact us if any trouble getting GI follow up.  Patient Instructions  Please see your gastroenterologist for follow up     Lucretia Kern.

## 2015-06-28 NOTE — Patient Instructions (Signed)
Please see your gastroenterologist for follow up

## 2015-06-28 NOTE — Telephone Encounter (Signed)
Pt aware of appt, script sent to pharmacy.

## 2015-07-01 ENCOUNTER — Ambulatory Visit (INDEPENDENT_AMBULATORY_CARE_PROVIDER_SITE_OTHER): Payer: Managed Care, Other (non HMO) | Admitting: Internal Medicine

## 2015-07-01 ENCOUNTER — Encounter: Payer: Self-pay | Admitting: Internal Medicine

## 2015-07-01 VITALS — BP 110/70 | HR 88 | Ht 77.0 in | Wt 264.0 lb

## 2015-07-01 DIAGNOSIS — D126 Benign neoplasm of colon, unspecified: Secondary | ICD-10-CM | POA: Diagnosis not present

## 2015-07-01 DIAGNOSIS — R1084 Generalized abdominal pain: Secondary | ICD-10-CM | POA: Diagnosis not present

## 2015-07-01 DIAGNOSIS — Q8789 Other specified congenital malformation syndromes, not elsewhere classified: Secondary | ICD-10-CM

## 2015-07-01 DIAGNOSIS — D135 Benign neoplasm of extrahepatic bile ducts: Secondary | ICD-10-CM

## 2015-07-01 DIAGNOSIS — K219 Gastro-esophageal reflux disease without esophagitis: Secondary | ICD-10-CM

## 2015-07-01 NOTE — Progress Notes (Signed)
HISTORY OF PRESENT ILLNESS:  Jonathan Marsh is a 29 y.o. male with a history of Gardner syndrome and remote colectomy. 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 Gayleen Marsh at [facility] and underwent endoscopic ampullectomy 06/10/2015. The pathology revealed adenoma without high-grade dysplasia or malignancy. That evening he developed severe abdominal pain with vomiting and was hospitalized at [facility]. He had a mild leukocytosis of 14.3 without fever. Liver function tests and lipase were normal. CT scan of the abdomen and pelvis revealed several locules of gas extraluminal adjacent to the second duodenum without gross perforation. Incidental desmoplastic reaction in the right lower quadrant noted (not mentioned on CT here earlier this year). He was treated conservatively and discharged home 06/16/2015. Follow-up CT prior to discharge found resolution of previous abnormalities. Since discharge home the patient reports problems with intermittent epigastric pain generally one hour after meals. He has had some intermittent spells of vomiting. He saw his PCP 3 days ago who requested GI evaluation. On that day I prescribed omeprazole 40 mg daily. He has yet picked this up. Does have a history of GERD. Patient did notice that on 2 occasions he developed epistaxis after nonbloody emesis. Currently he reports feeling well and requests going back to work.  REVIEW OF SYSTEMS:  All non-GI ROS negative except for insomnia  Past Medical History  Diagnosis Date  . Gardner syndrome   . GERD (gastroesophageal reflux disease)   . Headache     mild - stress related  . Gastric polyp   . Ampullary adenoma     Past Surgical History  Procedure Laterality Date  . Colectomy      age 83 due to cancerous polyps  . Colonoscopy    . Upper gastrointestinal endoscopy    . Esophagogastroduodenoscopy N/A 03/30/2015    Procedure: ESOPHAGOGASTRODUODENOSCOPY  (EGD);  Surgeon: Jonathan Shipper, MD;  Location: [facility];  Service: Endoscopy;  Laterality: N/A;  need side viewing scope    Social History Jonathan Marsh  reports that he has quit smoking. He has never used smokeless tobacco. He reports that he does not drink alcohol or use illicit drugs.  family history includes Cancer in his maternal grandfather, maternal grandmother, and mother; Drug abuse in his mother.  No Known Allergies     PHYSICAL EXAMINATION: Vital signs: BP 110/70 mmHg  Pulse 88  Ht 6\' 5"  (1.956 m)  Wt 264 lb (119.75 kg)  BMI 31.30 kg/m2 General: Well-developed, well-nourished, no acute distress HEENT: Sclerae are anicteric, conjunctiva pink. Oral mucosa intact Lungs: Clear Heart: Regular Abdomen: soft, mild epigastric tenderness, nondistended, no obvious ascites, no peritoneal signs, normal bowel sounds. No organomegaly. Extremities: No clubbing cyanosis or edema Psychiatric: alert and oriented x3. Cooperative   ASSESSMENT:  #1. Problems with ongoing epigastric pain post ampullectomy 06/10/2015 with recent hospitalization as described (06/10/2015 through 06/16/2015). Possible etiologies include iatrogenic duodenal ulcer, exacerbation of GERD in a patient with known history of the same, possible delayed ulcer induced irritation of the pancreas (no pancreatic abnormalities on CT or biochemistries 06/10/2015). #2Alcario Marsh syndrome #3. Desmoplastic-appearing reaction right lower quadrant on CT scan at [facility] as described  PLAN:  #1. Initiate omeprazole 40 mg twice daily #2. Low-fat diet #3. Okay to return to work note provided #4. I asked patient to contact us early next week for follow-up on his progress. He agreed #5. Plan relook EGD with side viewing scope in about 6 months #6.  Aware of right lower quadrant changes on recent CT

## 2015-07-01 NOTE — Patient Instructions (Signed)
Continue taking your Omeprazole.  Call [place] on Tuesday to update her on your condition

## 2015-11-09 ENCOUNTER — Other Ambulatory Visit: Payer: Self-pay

## 2015-11-09 ENCOUNTER — Telehealth: Payer: Self-pay

## 2015-11-09 DIAGNOSIS — R1013 Epigastric pain: Secondary | ICD-10-CM

## 2015-11-09 NOTE — Telephone Encounter (Signed)
Pt scheduled for EGD at [facility] 01/18/16 @8 :30am for EGD with ERCP scope with MAC. Left message for pt to call back.  Spoke with pt and he is aware.

## 2015-11-09 NOTE — Telephone Encounter (Signed)
-----   Message from Irene Shipper, MD sent at 11/08/2015  5:06 PM EDT ----- Regarding: RE: EGD with ERCP scope Yes, please. Thanks  ----- Message -----    From: Algernon Huxley, RN    Sent: 11/08/2015   3:59 PM      To: Irene Shipper, MD Subject: FW: EGD with ERCP scope                        Dr. Henrene Pastor,  Are you ok with me scheduling this for your Tuesday 01/18/16 at [facility]?  Thanks, Vaughan Basta   ----- Message -----    From: Algernon Huxley, RN    Sent: 10/14/2015      To: Algernon Huxley, RN Subject: EGD with ERCP scope                            Pt needs procedure at the hospital in May with Dr. Henrene Pastor

## 2015-12-20 ENCOUNTER — Telehealth: Payer: Self-pay | Admitting: *Deleted

## 2015-12-20 DIAGNOSIS — D126 Benign neoplasm of colon, unspecified: Principal | ICD-10-CM

## 2015-12-20 DIAGNOSIS — Q8789 Other specified congenital malformation syndromes, not elsewhere classified: Secondary | ICD-10-CM

## 2015-12-20 NOTE — Telephone Encounter (Signed)
I left a message for the pt to return my call. 

## 2015-12-20 NOTE — Telephone Encounter (Signed)
-----   Message from Lucretia Kern, DO sent at 12/20/2015  6:16 AM EDT ----- Please call pt. He never did the thyroid US we ordered. Please help him to reschedule. Thanks. ----- Message -----    From: SYSTEM    Sent: 12/18/2015  12:06 AM      To: Lucretia Kern, DO

## 2015-12-20 NOTE — Telephone Encounter (Signed)
Patient called back and I informed him of the message below.  He stated he has had a lot going on with his job, insurance,etc and would like to have this test ordered.  I entered the referral again since this expired and he is aware someone will call with appt info.

## 2015-12-28 ENCOUNTER — Other Ambulatory Visit: Payer: Managed Care, Other (non HMO)

## 2016-01-03 ENCOUNTER — Encounter (HOSPITAL_COMMUNITY): Payer: Self-pay | Admitting: *Deleted

## 2016-01-04 ENCOUNTER — Other Ambulatory Visit: Payer: Managed Care, Other (non HMO)

## 2016-01-07 ENCOUNTER — Other Ambulatory Visit: Payer: Managed Care, Other (non HMO)

## 2016-01-14 ENCOUNTER — Other Ambulatory Visit: Payer: BLUE CROSS/BLUE SHIELD

## 2016-01-18 ENCOUNTER — Ambulatory Visit (HOSPITAL_COMMUNITY)
Admission: RE | Admit: 2016-01-18 | Discharge: 2016-01-18 | Disposition: A | Discharge status: Home / Self Care | Payer: BLUE CROSS/BLUE SHIELD | Source: Ambulatory Visit | Attending: Internal Medicine | Admitting: Internal Medicine

## 2016-01-18 ENCOUNTER — Telehealth: Payer: Self-pay | Admitting: *Deleted

## 2016-01-18 ENCOUNTER — Encounter: Payer: Self-pay | Admitting: *Deleted

## 2016-01-18 ENCOUNTER — Encounter (HOSPITAL_COMMUNITY): Payer: Self-pay

## 2016-01-18 ENCOUNTER — Ambulatory Visit (HOSPITAL_COMMUNITY): Payer: BLUE CROSS/BLUE SHIELD | Admitting: Registered Nurse

## 2016-01-18 ENCOUNTER — Encounter (HOSPITAL_COMMUNITY)
Admission: RE | Disposition: A | Discharge status: Home / Self Care | Payer: Self-pay | Source: Ambulatory Visit | Attending: Internal Medicine

## 2016-01-18 DIAGNOSIS — Z85068 Personal history of other malignant neoplasm of small intestine: Secondary | ICD-10-CM | POA: Diagnosis not present

## 2016-01-18 DIAGNOSIS — Z87891 Personal history of nicotine dependence: Secondary | ICD-10-CM | POA: Diagnosis not present

## 2016-01-18 DIAGNOSIS — D135 Benign neoplasm of extrahepatic bile ducts: Secondary | ICD-10-CM | POA: Diagnosis not present

## 2016-01-18 DIAGNOSIS — Z9049 Acquired absence of other specified parts of digestive tract: Secondary | ICD-10-CM | POA: Diagnosis not present

## 2016-01-18 DIAGNOSIS — R1013 Epigastric pain: Secondary | ICD-10-CM

## 2016-01-18 DIAGNOSIS — Z08 Encounter for follow-up examination after completed treatment for malignant neoplasm: Secondary | ICD-10-CM | POA: Diagnosis present

## 2016-01-18 DIAGNOSIS — K317 Polyp of stomach and duodenum: Secondary | ICD-10-CM | POA: Insufficient documentation

## 2016-01-18 DIAGNOSIS — D132 Benign neoplasm of duodenum: Secondary | ICD-10-CM | POA: Diagnosis not present

## 2016-01-18 HISTORY — PX: ESOPHAGOGASTRODUODENOSCOPY: SHX5428

## 2016-01-18 SURGERY — EGD (ESOPHAGOGASTRODUODENOSCOPY)
Anesthesia: Monitor Anesthesia Care

## 2016-01-18 MED ORDER — LIDOCAINE HCL (CARDIAC) 20 MG/ML IV SOLN
INTRAVENOUS | Status: AC
  Filled 2016-01-18: qty 5

## 2016-01-18 MED ORDER — GLYCOPYRROLATE 0.2 MG/ML IJ SOLN
INTRAMUSCULAR | Status: AC
  Filled 2016-01-18: qty 1

## 2016-01-18 MED ORDER — LACTATED RINGERS IV SOLN
INTRAVENOUS | Status: DC | PRN
  Administered 2016-01-18: 08:00:00 via INTRAVENOUS

## 2016-01-18 MED ORDER — FENTANYL CITRATE (PF) 100 MCG/2ML IJ SOLN: 25.0000 ug | INTRAMUSCULAR | Status: DC | PRN

## 2016-01-18 MED ORDER — SODIUM CHLORIDE 0.9 % IV SOLN: INTRAVENOUS | Status: DC

## 2016-01-18 MED ORDER — PROPOFOL 500 MG/50ML IV EMUL
INTRAVENOUS | Status: DC | PRN
  Administered 2016-01-18: 300 ug/kg/min via INTRAVENOUS

## 2016-01-18 MED ORDER — PROPOFOL 10 MG/ML IV BOLUS
INTRAVENOUS | Status: AC
  Filled 2016-01-18: qty 40

## 2016-01-18 MED ORDER — PROMETHAZINE HCL 25 MG/ML IJ SOLN
6.2500 mg | INTRAMUSCULAR | Status: DC | PRN
  Filled 2016-01-18: qty 1

## 2016-01-18 MED ORDER — PROPOFOL 10 MG/ML IV BOLUS
INTRAVENOUS | Status: AC
  Filled 2016-01-18: qty 20

## 2016-01-18 MED ORDER — LIDOCAINE HCL (CARDIAC) 20 MG/ML IV SOLN
INTRAVENOUS | Status: DC | PRN
  Administered 2016-01-18: 100 mg via INTRAVENOUS

## 2016-01-18 MED ORDER — GLYCOPYRROLATE 0.2 MG/ML IJ SOLN
INTRAMUSCULAR | Status: DC | PRN
  Administered 2016-01-18: 0.2 mg via INTRAVENOUS

## 2016-01-18 NOTE — Transfer of Care (Signed)
Immediate Anesthesia Transfer of Care Note  Patient: Jonathan Marsh  Procedure(s) Performed: Procedure(s) with comments: ESOPHAGOGASTRODUODENOSCOPY (EGD) (N/A) -  use ercp scope   Patient Location: PACU  Anesthesia Type:MAC  Level of Consciousness: awake, oriented and patient cooperative  Airway & Oxygen Therapy: Patient Spontanous Breathing and Patient connected to nasal cannula oxygen  Post-op Assessment: Report given to RN, Post -op Vital signs reviewed and stable and Patient moving all extremities X 4  Post vital signs: stable  Last Vitals:  Filed Vitals:   01/18/16 0822 01/18/16 0823  BP: 102/80   Pulse: 66   Temp:  36.5 C  Resp: 22     Last Pain: There were no vitals filed for this visit.       Complications: No apparent anesthesia complications

## 2016-01-18 NOTE — Anesthesia Preprocedure Evaluation (Addendum)
Anesthesia Evaluation  Patient identified by MRN, date of birth, ID band Patient awake    Reviewed: Allergy & Precautions  Airway Mallampati: II  TM Distance: >3 FB Neck ROM: Full    Dental   Pulmonary former smoker,    breath sounds clear to auscultation       Cardiovascular negative cardio ROS   Rhythm:Regular Rate:Normal     Neuro/Psych  Headaches,    GI/Hepatic Neg liver ROS, GERD  ,  Endo/Other  negative endocrine ROS  Renal/GU negative Renal ROS     Musculoskeletal   Abdominal   Peds  Hematology   Anesthesia Other Findings   Reproductive/Obstetrics                           Anesthesia Physical Anesthesia Plan  ASA: III  Anesthesia Plan: MAC   Post-op Pain Management:    Induction: Intravenous  Airway Management Planned: Simple Face Mask  Additional Equipment:   Intra-op Plan:   Post-operative Plan:   Informed Consent: I have reviewed the patients History and Physical, chart, labs and discussed the procedure including the risks, benefits and alternatives for the proposed anesthesia with the patient or authorized representative who has indicated his/her understanding and acceptance.   Dental advisory given  Plan Discussed with: CRNA and Anesthesiologist  Anesthesia Plan Comments:         Anesthesia Quick Evaluation

## 2016-01-18 NOTE — Telephone Encounter (Signed)
I called the pt and was unable to leave a message due to his voicemail being full.  Letter mailed to the pts home address to call [facility] at [phone] to schedule the ultrasound.

## 2016-01-18 NOTE — Telephone Encounter (Signed)
-----   Message from Lucretia Kern, DO sent at 01/18/2016  8:01 AM EDT ----- Can you tell if he has set up the [place]? Thanks. ----- Message -----    From: SYSTEM    Sent: 01/15/2016  12:05 AM      To: Lucretia Kern, DO

## 2016-01-18 NOTE — Discharge Instructions (Signed)
Esophagogastroduodenoscopy Esophagogastroduodenoscopy (EGD) is a procedure that is used to examine the lining of the esophagus, stomach, and first part of the small intestine (duodenum). A long, flexible, lighted tube with a camera attached (endoscope) is inserted down the throat to view these organs. This procedure is done to detect problems or abnormalities, such as inflammation, bleeding, ulcers, or growths, in order to treat them. The procedure lasts 5-20 minutes. It is usually an outpatient procedure, but it may need to be performed in a hospital in emergency cases. LET [facility] CARE PROVIDER KNOW ABOUT:  Any allergies you have.  All medicines you are taking, including vitamins, herbs, eye drops, creams, and over-the-counter medicines.  Previous problems you or members of your family have had with the use of anesthetics.  Any blood disorders you have.  Previous surgeries you have had.  Medical conditions you have. RISKS AND COMPLICATIONS Generally, this is a safe procedure. However, problems can occur and include:  Infection.  Bleeding.  Tearing (perforation) of the esophagus, stomach, or duodenum.  Difficulty breathing or not being able to breathe.  Excessive sweating.  Spasms of the larynx.  Slowed heartbeat.  Low blood pressure. BEFORE THE PROCEDURE  Do not eat or drink anything after midnight on the night before the procedure or as directed by your health care provider.  Do not take your regular medicines before the procedure if your health care provider asks you not to. Ask your health care provider about changing or stopping those medicines.  If you wear dentures, be prepared to remove them before the procedure.  Arrange for someone to drive you home after the procedure. PROCEDURE  A numbing medicine (local anesthetic) may be sprayed in your throat for comfort and to stop you from gagging or coughing.  You will have an IV tube inserted in a vein in your  hand or arm. You will receive medicines and fluids through this tube.  You will be given a medicine to relax you (sedative).  A pain reliever will be given through the IV tube.  A mouth guard may be placed in your mouth to protect your teeth and to keep you from biting on the endoscope.  You will be asked to lie on your left side.  The endoscope will be inserted down your throat and into your esophagus, stomach, and duodenum.  Air will be put through the endoscope to allow your health care provider to clearly view the lining of your esophagus.  The lining of your esophagus, stomach, and duodenum will be examined. During the exam, your health care provider may:  Remove tissue to be examined under a microscope (biopsy) for inflammation, infection, or other medical problems.  Remove growths.  Remove objects (foreign bodies) that are stuck.  Treat any bleeding with medicines or other devices that stop tissues from bleeding (hot cautery, clipping devices).  Widen (dilate) or stretch narrowed areas of your esophagus and stomach.  The endoscope will be withdrawn. AFTER THE PROCEDURE  You will be taken to a recovery area for observation. Your blood pressure, heart rate, breathing rate, and blood oxygen level will be monitored often until the medicines you were given have worn off.  Do not eat or drink anything until the numbing medicine has worn off and your gag reflex has returned. You may choke.  Your health care provider should be able to discuss his or her findings with you. It will take longer to discuss the test results if any biopsies were taken.  This information is not intended to replace advice given to you by your health care provider. Make sure you discuss any questions you have with your health care provider. °  °Document Released: 12/01/2004 Document Revised: 08/21/2014 Document Reviewed: 07/03/2012 °Elsevier Interactive Patient Education ©2016 [employer] Inc[employer] ° °

## 2016-01-18 NOTE — Anesthesia Postprocedure Evaluation (Signed)
Anesthesia Post Note  Patient: Jonathan Marsh  Procedure(s) Performed: Procedure(s) (LRB): ESOPHAGOGASTRODUODENOSCOPY (EGD) (N/A)  Patient location during evaluation: PACU Anesthesia Type: MAC Level of consciousness: awake Pain management: pain level controlled Cardiovascular status: stable Anesthetic complications: no    Last Vitals:  Filed Vitals:   01/18/16 1015 01/18/16 1020  BP:    Pulse: 65 61  Temp:    Resp: 20 16    Last Pain: There were no vitals filed for this visit.               EDWARDS,Mikhai Bienvenue

## 2016-01-18 NOTE — H&P (Signed)
HISTORY OF PRESENT ILLNESS:  Jonathan Marsh is a 30 y.o. male with gardner's. S/P colectomy remotely. S/P ampullectomy past year for adenoma. Now for suveillance  REVIEW OF SYSTEMS:  All non-GI ROS negative  Past Medical History  Diagnosis Date  . Gardner syndrome   . GERD (gastroesophageal reflux disease)   . Headache     mild - stress related  . Gastric polyp   . Ampullary adenoma     Past Surgical History  Procedure Laterality Date  . Colectomy      age 35 due to cancerous polyps  . Colonoscopy    . Upper gastrointestinal endoscopy    . Esophagogastroduodenoscopy N/A 03/30/2015    Procedure: ESOPHAGOGASTRODUODENOSCOPY (EGD);  Surgeon: Irene Shipper, MD;  Location: [facility];  Service: Endoscopy;  Laterality: N/A;  need side viewing scope    Social History Jonathan Marsh  reports that he quit smoking about 8 years ago. His smoking use included Cigarettes. He has never used smokeless tobacco. He reports that he does not drink alcohol or use illicit drugs.  family history includes Cancer in his maternal grandfather, maternal grandmother, and mother; Drug abuse in his mother.  No Known Allergies     PHYSICAL EXAMINATION: Vital signs: BP 102/80 mmHg  Pulse 66  Temp(Src) 97.7 F (36.5 C)  Resp 22  Ht 6\' 5"  (1.956 m)  Wt 264 lb (119.75 kg)  BMI 31.30 kg/m2  SpO2 100% General: Well-developed, well-nourished, no acute distress HEENT: Sclerae are anicteric, conjunctiva pink. Oral mucosa intact Lungs: Clear Heart: Regular Abdomen: soft, nontender, nondistended, no obvious ascites, no peritoneal signs, normal bowel sounds. No organomegaly. Extremities: No edema Psychiatric: alert and oriented x3. Cooperative     ASSESSMENT:  1. Gardners 2. S/P Ampullectomy 3. Remote colectomy   PLAN:  1. EGD with side viewing scope.The nature of the procedure, as well as the risks, benefits, and alternatives were carefully and thoroughly reviewed with the patient. Ample time for  discussion and questions allowed. The patient understood, was satisfied, and agreed to proceed.

## 2016-01-18 NOTE — Op Note (Signed)
[facility] Patient Name: Jonathan Marsh Procedure Date: 01/18/2016 MRN: [id number] Attending MD: Docia Chuck. Henrene Pastor , MD Date of Birth: 08-Apr-1986 CSN: [id number] Age: 30 Admit Type: Outpatient Procedure:                Upper GI endoscopy, with snare polypectomy (cold)                            and biopsies Indications:              Surveillance procedure. History of Gardner                            syndrome. Ampullectomy October 2016 for ampullary                            adenoma without dysplasia (Dr. Lysle Rubens) Providers:                Docia Chuck. Henrene Pastor, MD, Dustin Flock, RN, Ralene Bathe,                            Technician, Enrigue Catena, CRNA Referring MD:              Medicines:                Monitored Anesthesia Care Complications:            No immediate complications. Estimated Blood Loss:     Estimated blood loss: none. Procedure:                Pre-Anesthesia Assessment:                           - Prior to the procedure, a History and Physical                            was performed, and patient medications and                            allergies were reviewed. The patient's tolerance of                            previous anesthesia was also reviewed. The risks                            and benefits of the procedure and the sedation                            options and risks were discussed with the patient.                            All questions were answered, and informed consent                            was obtained. Prior Anticoagulants: The patient has  taken no previous anticoagulant or antiplatelet                            agents. ASA Grade Assessment: II - A patient with                            mild systemic disease. After reviewing the risks                            and benefits, the patient was deemed in                            satisfactory condition to undergo the procedure.                           After  obtaining informed consent, the endoscope was                            passed under direct vision. Throughout the                            procedure, the patient's blood pressure, pulse, and                            oxygen saturations were monitored continuously. The                            [id number]) scope was introduced through                            the mouth, and advanced to the second part of                            duodenum. The upper GI endoscopy was accomplished                            without difficulty. The patient tolerated the                            procedure well. Scope In: Scope Out: Findings:      The gastroesophageal junction was normal. Other portions of the       esophagus cannot be examined with side-viewing scope.      Multiple 1 to 5 mm pedunculated polyps were found in the cardia, fundus,       and proximal body.The stomach was otherwise normal. Retroflexion       performed      A single 5 mm semi-sessile polyp was found in the first/second portion       of the duodenum. The polyp was removed with a cold snare. Resection and       retrieval were complete.      The ampulla was normal, with slight scarring from prior ampullectomy.       Biopsies were taken around the ampullary region near the ampulla.       Biopsies were taken with a  cold forceps for histology. Impression:               - Normal gastroesophageal junction.                           - Multiple gastric polyps.                           - A single duodenal polyp. Resected and retrieved.                           - Normal ampulla. Biopsied. Moderate Sedation:      none Recommendation:           - Await pathology results.                           - Patient has a contact number available for                            emergencies. The signs and symptoms of potential                            delayed complications were discussed with the                             patient. Return to normal activities tomorrow.                            Written discharge instructions were provided to the                            patient.                           - Resume previous diet.                           - Continue present medications.                           - Repeat upper endoscopy for surveillance based on                            pathology results. Procedure Code(s):        --- Professional ---                           [phone], Esophagogastroduodenoscopy, flexible,                            transoral; with removal of tumor(s), polyp(s), or                            other lesion(s) by snare technique Diagnosis Code(s):        --- Professional ---  K31.7, Polyp of stomach and duodenum CPT copyright 2016 American Medical Association. All rights reserved. The codes documented in this report are preliminary and upon coder review may  be revised to meet current compliance requirements. Docia Chuck. Henrene Pastor, MD 01/18/2016 9:33:12 AM This report has been signed electronically. Number of Addenda: 0

## 2016-01-18 NOTE — Anesthesia Postprocedure Evaluation (Signed)
Anesthesia Post Note  Patient: Jonathan Marsh  Procedure(s) Performed: Procedure(s) (LRB): ESOPHAGOGASTRODUODENOSCOPY (EGD) (N/A)  Patient location during evaluation: PACU Anesthesia Type: MAC Level of consciousness: awake Pain management: pain level controlled Vital Signs Assessment: post-procedure vital signs reviewed and stable Respiratory status: spontaneous breathing Cardiovascular status: stable Anesthetic complications: no    Last Vitals:  Filed Vitals:   01/18/16 0921 01/18/16 0925  BP: 77/63 88/45  Pulse: 68 67  Temp:    Resp: 17 18    Last Pain: There were no vitals filed for this visit.               EDWARDS,Cannon Arreola

## 2016-01-19 ENCOUNTER — Encounter: Payer: Self-pay | Admitting: Internal Medicine

## 2016-01-20 ENCOUNTER — Encounter (HOSPITAL_COMMUNITY): Payer: Self-pay | Admitting: Internal Medicine

## 2016-11-15 ENCOUNTER — Telehealth: Payer: Self-pay | Admitting: Internal Medicine

## 2016-11-15 ENCOUNTER — Emergency Department (HOSPITAL_COMMUNITY): Payer: BLUE CROSS/BLUE SHIELD

## 2016-11-15 ENCOUNTER — Encounter (HOSPITAL_COMMUNITY): Payer: Self-pay

## 2016-11-15 ENCOUNTER — Emergency Department (HOSPITAL_COMMUNITY)
Admission: EM | Admit: 2016-11-15 | Discharge: 2016-11-16 | Disposition: A | Discharge status: Home / Self Care | Payer: BLUE CROSS/BLUE SHIELD | Attending: Emergency Medicine | Admitting: Emergency Medicine

## 2016-11-15 DIAGNOSIS — R1907 Generalized intra-abdominal and pelvic swelling, mass and lump: Secondary | ICD-10-CM | POA: Diagnosis not present

## 2016-11-15 DIAGNOSIS — Z87891 Personal history of nicotine dependence: Secondary | ICD-10-CM | POA: Insufficient documentation

## 2016-11-15 DIAGNOSIS — Z79899 Other long term (current) drug therapy: Secondary | ICD-10-CM | POA: Insufficient documentation

## 2016-11-15 DIAGNOSIS — R1031 Right lower quadrant pain: Secondary | ICD-10-CM

## 2016-11-15 DIAGNOSIS — D4989 Neoplasm of unspecified behavior of other specified sites: Secondary | ICD-10-CM | POA: Insufficient documentation

## 2016-11-15 LAB — URINALYSIS, ROUTINE W REFLEX MICROSCOPIC
Bilirubin Urine: NEGATIVE
GLUCOSE, UA: NEGATIVE mg/dL
Hgb urine dipstick: NEGATIVE
Ketones, ur: NEGATIVE mg/dL
LEUKOCYTES UA: NEGATIVE
NITRITE: NEGATIVE
PH: 5 (ref 5.0–8.0)
Protein, ur: NEGATIVE mg/dL
SPECIFIC GRAVITY, URINE: 1.018 (ref 1.005–1.030)

## 2016-11-15 LAB — COMPREHENSIVE METABOLIC PANEL
ALBUMIN: 4.4 g/dL (ref 3.5–5.0)
ALT: 34 U/L (ref 17–63)
ANION GAP: 8 (ref 5–15)
AST: 28 U/L (ref 15–41)
Alkaline Phosphatase: 58 U/L (ref 38–126)
BILIRUBIN TOTAL: 0.6 mg/dL (ref 0.3–1.2)
BUN: 13 mg/dL (ref 6–20)
CHLORIDE: 105 mmol/L (ref 101–111)
CO2: 25 mmol/L (ref 22–32)
Calcium: 9.3 mg/dL (ref 8.9–10.3)
Creatinine, Ser: 1.07 mg/dL (ref 0.61–1.24)
GFR calc Af Amer: >60 mL/min (ref >60)
GFR calc non Af Amer: >60 mL/min (ref >60)
GLUCOSE: 116 mg/dL — AB (ref 65–99)
POTASSIUM: 3.7 mmol/L (ref 3.5–5.1)
Sodium: 138 mmol/L (ref 135–145)
TOTAL PROTEIN: 7.1 g/dL (ref 6.5–8.1)

## 2016-11-15 LAB — CBC
HEMATOCRIT: 42.8 % (ref 39.0–52.0)
HEMOGLOBIN: 14.8 g/dL (ref 13.0–17.0)
MCH: 30.8 pg (ref 26.0–34.0)
MCHC: 34.6 g/dL (ref 30.0–36.0)
MCV: 89.2 fL (ref 78.0–100.0)
Platelets: 289 10*3/uL (ref 150–400)
RBC: 4.8 MIL/uL (ref 4.22–5.81)
RDW: 12.8 % (ref 11.5–15.5)
WBC: 9.1 10*3/uL (ref 4.0–10.5)

## 2016-11-15 LAB — LIPASE, BLOOD: LIPASE: 48 U/L (ref 11–51)

## 2016-11-15 MED ORDER — ONDANSETRON 4 MG PO TBDP
4.0000 mg | ORAL_TABLET | Freq: Once | ORAL | Status: AC | PRN
  Administered 2016-11-15: 4 mg via ORAL
  Filled 2016-11-15: qty 1

## 2016-11-15 MED ORDER — ONDANSETRON HCL 4 MG/2ML IJ SOLN
4.0000 mg | Freq: Once | INTRAMUSCULAR | Status: AC
  Administered 2016-11-15: 4 mg via INTRAVENOUS
  Filled 2016-11-15: qty 2

## 2016-11-15 MED ORDER — FENTANYL CITRATE (PF) 100 MCG/2ML IJ SOLN
50.0000 ug | Freq: Once | INTRAMUSCULAR | Status: AC
  Administered 2016-11-15: 50 ug via INTRAVENOUS
  Filled 2016-11-15: qty 2

## 2016-11-15 MED ORDER — OXYCODONE-ACETAMINOPHEN 5-325 MG PO TABS
1.0000 | ORAL_TABLET | ORAL | Status: DC | PRN
  Administered 2016-11-15: 1 via ORAL
  Filled 2016-11-15: qty 1

## 2016-11-15 MED ORDER — SODIUM CHLORIDE 0.9 % IV BOLUS (SEPSIS)
1000.0000 mL | Freq: Once | INTRAVENOUS | Status: AC
  Administered 2016-11-15: 1000 mL via INTRAVENOUS

## 2016-11-15 NOTE — ED Triage Notes (Signed)
PT C/O RLQ PAIN AND A "LUMP" X2 DAYS. NAUSEA, NO VOMITING OR FEVER.

## 2016-11-15 NOTE — ED Provider Notes (Signed)
[facility] DEPT Provider Note   CSN: [id number] Arrival date & time: 11/15/16  1734   By signing my name below, I, Jonathan Marsh, attest that this documentation has been prepared under the direction and in the presence of [name], PA-C,. Electronically Signed: [name], Scribe. 11/15/2016. 10:59 PM.  History   Chief Complaint Chief Complaint  Patient presents with  . Abdominal Pain    RLQ    The history is provided by the patient. No language interpreter was used.     HPI Comments:  Jonathan Marsh is a 31 y.o. male with a history of Gardner Syndrome, s/p colectomy at 32 years old, who presents to the Emergency Department complaining of intermittent RLQ abdominal pain x 2 days. He states his pain is sharp at time of onset. Today's episode of pain has been constant since this AM. Pt notes the right abdomen felt hard and describes pressure in his abdomen as well.  Sitting straight up improves his pain mildly. He has associated nausea. He called his GI today and was advised to come to ED for further evaluation. Pt denies fever, chills, vomiting, dysuria, testicular pain, and penile discharge. No recent travel or antibiotic use. His Last BM was ~ 20 minutes ago. No blood seen in stool. Pt denies h/o hernia. He also has a h/o ampulla removal.   GI- [name]   Past Medical History:  Diagnosis Date  . Ampullary adenoma   . Gardner syndrome   . Gastric polyp   . GERD (gastroesophageal reflux disease)   . Headache    mild - stress related    Patient Active Problem List   Diagnosis Date Noted  . Adenomatous duodenal polyp   . Ampullary adenoma   . Multiple gastric polyps   . 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 Stress headaches 08/18/2014  . GAD (generalized anxiety disorder) 08/18/2014    Past Surgical History:  Procedure Laterality Date  . COLECTOMY     age 75 due to cancerous polyps  . COLONOSCOPY    . ESOPHAGOGASTRODUODENOSCOPY N/A 03/30/2015   Procedure:  ESOPHAGOGASTRODUODENOSCOPY (EGD);  Surgeon: Irene Shipper, MD;  Location: [facility];  Service: Endoscopy;  Laterality: N/A;  need side viewing scope  . ESOPHAGOGASTRODUODENOSCOPY N/A 01/18/2016   Procedure: ESOPHAGOGASTRODUODENOSCOPY (EGD);  Surgeon: Irene Shipper, MD;  Location: [facility];  Service: Endoscopy;  Laterality: N/A;   use ercp scope   . UPPER GASTROINTESTINAL ENDOSCOPY       Home Medications    Prior to Admission medications   Medication Sig Start Date End Date Taking? Authorizing Provider  Melatonin 5 MG TABS Take 1 tablet by mouth at bedtime as needed (sleep).   Yes Historical Provider, MD  omeprazole (PRILOSEC) 40 MG capsule Take 1 capsule (40 mg total) by mouth daily. Patient not taking: Reported on 11/15/2016 06/28/15   Irene Shipper, MD  ondansetron [place] ODT) 8 MG disintegrating tablet 8mg  ODT q4 hours prn nausea 11/16/16   Jarrett Soho Bernadett Milian, PA-C  oxyCODONE-acetaminophen (PERCOCET) 5-325 MG tablet Take 1-2 tablets by mouth every 6 (six) hours as needed. 11/16/16   Jarrett Soho Ellen Goris, PA-C    Family History Family History  Problem Relation Age of Onset  . Cancer Mother     GI cancer  . Drug abuse Mother   . Cancer Maternal Grandmother     lung  . Cancer Maternal Grandfather     colon ca    Social History Social History  Substance Use Topics  . Smoking  status: Former Smoker    Types: Cigarettes    Quit date: 01/01/2008  . Smokeless tobacco: Never Used  . Alcohol use No     Allergies   Patient has no known allergies.   Review of Systems Review of Systems  Constitutional: Negative for chills and fever.  Gastrointestinal: Positive for abdominal pain and nausea. Negative for blood in stool and vomiting.  All other systems reviewed and are negative.    Physical Exam Updated Vital Signs BP 109/62 (BP Location: Left Arm)   Pulse 63   Temp 97.6 F (36.4 C) (Oral)   Resp 17   Ht 6\' 6"  (1.981 m)   Wt 271 lb (122.9 kg)   SpO2 95%   BMI 31.32 kg/m    Physical Exam  Constitutional: He appears well-developed and well-nourished. No distress.  Awake, alert, nontoxic appearance  HENT:  Head: Normocephalic and atraumatic.  Mouth/Throat: Oropharynx is clear and moist. No oropharyngeal exudate.  Eyes: Conjunctivae are normal. No scleral icterus.  Neck: Normal range of motion. Neck supple.  Cardiovascular: Normal rate, regular rhythm and intact distal pulses.   Pulmonary/Chest: Effort normal and breath sounds normal. No respiratory distress. He has no wheezes.  Equal chest expansion  Abdominal: Soft. Bowel sounds are normal. He exhibits distension and mass (RLQ). There is tenderness. There is no rebound and no guarding. Hernia confirmed negative in the right inguinal area and confirmed negative in the left inguinal area.  Genitourinary: Testes normal and penis normal. Cremasteric reflex is present. Circumcised.  Genitourinary Comments: Chaperone was present for exam which was performed with no discomfort or complications.   Musculoskeletal: Normal range of motion. He exhibits no edema.  Lymphadenopathy: No inguinal adenopathy noted on the right or left side.  Neurological: He is alert.  Speech is clear and goal oriented Moves extremities without ataxia  Skin: Skin is warm and dry. He is not diaphoretic.  Psychiatric: He has a normal mood and affect.  Nursing note and vitals reviewed.    ED Treatments / Results  DIAGNOSTIC STUDIES:  Oxygen Saturation is 98% on RA, normal by my interpretation.    COORDINATION OF CARE:  10:52 PM Discussed treatment plan with pt at bedside and pt agreed to plan.  Labs (all labs ordered are listed, but only abnormal results are displayed) Labs Reviewed  COMPREHENSIVE METABOLIC PANEL - Abnormal; Notable for the following:       Result Value   Glucose, Bld 116 (*)    All other components within normal limits  LIPASE, BLOOD  CBC  URINALYSIS, ROUTINE W REFLEX MICROSCOPIC    Radiology Ct Abdomen  Pelvis W Contrast  Result Date: 11/16/2016 CLINICAL DATA:  Intermittent right lower quadrant pain for 2 days with nausea. No leukocytosis with white blood cell count of 9.1. History of colectomy at age 55 for Gardner disease and gastric polyp. EXAM: CT ABDOMEN AND PELVIS WITH CONTRAST TECHNIQUE: Multidetector CT imaging of the abdomen and pelvis was performed using the standard protocol following bolus administration of intravenous contrast. CONTRAST:  100 cc Isovue-300 IV COMPARISON:  10/28/2014 CT FINDINGS: Lower chest: Normal size cardiac chambers. No pericardial effusion. 3 mm nodule along the left major fissure most likely representing a small intrafissure lymph node. Hepatobiliary: 9 mm hypodensity in the right hepatic lobe, nonspecific in etiology but may represent a small cyst or hemangioma. On prior exam it appears to have demonstrated subtle peripheral puddling favoring hemangioma. Isolated metastasis can't be entirely excluded but given relative stability believed less  likely. Pancreas: Unremarkable. No pancreatic ductal dilatation or surrounding inflammatory changes. Spleen: Normal in size without focal abnormality. Adrenals/Urinary Tract: Hypodense 2.3 x 1.2 cm adrenal nodule with Hounsfield unit of 23 consistent with a benign adenoma. Kidneys are normal, without renal calculi, focal lesion, or hydronephrosis. Bladder is unremarkable. Stomach/Bowel: Chain sutures from prior colectomy are noted in the region of the anorectal junction. The stomach is physiologically distended and filled with food. Proximal to mid small bowel is nondistended without evidence of obstruction. Vascular/Lymphatic: No significant vascular findings are present. No enlarged abdominal or pelvic lymph nodes small mesenteric lymph nodes are seen at the root of the mesentery. No inguinal or pelvic sidewall adenopathy. No iliac chain lymphadenopathy. Reproductive: Prostate is unremarkable. Other: There is an approximately 16.7 x 10.4  x 8.3 cm (CC x TRV x AP) mass in the right lower quadrant mesentery enveloping mesenteric vessels medially. It has a more irregular contour along its cephalad and medial margin and more circumscribed encapsulated appearance inferiorly and anteriorly. No evidence of intraparenchymal hemorrhage. The solid components are isodense to slightly hyperdense relative to adjacent muscle. There is a cystic component along its caudal margin measuring 7.9 x 5.5 x 5.3 cm (CC x TRV x AP). Musculoskeletal: No acute or significant osseous findings. IMPRESSION: 1. There is an approximately 16.7 x 10.4 x 8.3 cm (CC x TRV x AP) mass in the right lower quadrant mesentery enveloping mesenteric vessels medially. It has a more irregular contour along its cephalad and medial margin and more circumscribed encapsulated appearance inferiorly and anteriorly. No evidence of intraparenchymal hemorrhage. The solid components are isodense to slightly hyperdense relative to adjacent muscle. There is a cystic component along its caudal margin measuring 7.9 x 5.5 x 5.3 cm (CC x TRV x AP). 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 Lymphoma and an inflammatory phlegmon or some other considerations. 2. 2.3 x 1.2 cm hypodense right adrenal nodule consistent with a benign adenoma. 3. Stable 9 mm hypodensity in the right hepatic lobe, nonspecific but may represent a small hemangioma based on subtle peripheral puddling suggested on prior exam. This however still warrants close attention on follow-up. 4. Status post colectomy with intact anastomotic site near the anorectal junction. Electronically Signed   By: Ashley Royalty M.D.   On: 11/16/2016 01:34    Procedures Procedures (including critical care time)  Medications Ordered in ED Medications  oxyCODONE-acetaminophen (PERCOCET/ROXICET) 5-325 MG per tablet 1 tablet (1 tablet Oral Given 11/15/16 1809)  ondansetron  (ZOFRAN-ODT) disintegrating tablet 4 mg (4 mg Oral Given 11/15/16 1809)  sodium chloride 0.9 % bolus 1,000 mL (0 mLs Intravenous Stopped 11/16/16 0106)  fentaNYL (SUBLIMAZE) injection 50 mcg (50 mcg Intravenous Given 11/15/16 2314)  ondansetron (ZOFRAN) injection 4 mg (4 mg Intravenous Given 11/15/16 2310)  iopamidol (ISOVUE-300) 61 % injection 100 mL (100 mLs Intravenous Contrast Given 11/16/16 0019)  morphine 2 MG/ML injection 4 mg (4 mg Intravenous Given 11/16/16 0059)  oxyCODONE-acetaminophen (PERCOCET/ROXICET) 5-325 MG per tablet 2 tablet (2 tablets Oral Given 11/16/16 0326)     Initial Impression / Assessment and Plan / ED Course  I have reviewed the triage vital signs and the nursing notes.  Pertinent labs & imaging results that were available during my care of the patient were reviewed by me and considered in my medical decision making (see chart for details).  Clinical Course as of Nov 17 335  Thu Nov 16, 2016  0246 Discussed with Dr. Marcello Moores who is comfortable with clinic follow-up.  They will assist in getting patient and OP appointment for removal of the tumor.    [HM]  [id number] Narcotic database accessed. Patient has not had her prescriptions in the last 6 months.  [HM]  [phone] The patient was discussed with and seen by Dr. Florina Ou who agrees with the treatment plan and outpatient surgical follow-up.   [HM]    Clinical Course User Index [HM] [name], PA-C    Patient with right lower quadrant abdominal pain. Palpable mass on exam. Concern for possible incarcerated hernia versus tumor. 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  Potentially lymphoma versus phlegmon. Tissue without signs or symptoms of infection. He is without tachycardia, hypotension, fever or leukocytosis. Doubt phlegmon. CBC is reassuring without leukopenia, anemia or thrombocytopenia. Patient's pain is controlled here in the emergency department. He has tolerated by mouth without difficulty. Discussed the  patient with general surgery who will arrange for close follow-up.  Discussed findings and follow-up with patient and wife. They state understanding and are in agreement with the plan. Discussed reasons to return immediately to the emergency department.  Patient discharged with pain medication and Zofran.  Final Clinical Impressions(s) / ED Diagnoses   Final diagnoses:  Right lower quadrant abdominal pain  Intra-abdominal tumor    New Prescriptions New Prescriptions   ONDANSETRON (ZOFRAN ODT) 8 MG DISINTEGRATING TABLET    8mg  ODT q4 hours prn nausea   OXYCODONE-ACETAMINOPHEN (PERCOCET) 5-325 MG TABLET    Take 1-2 tablets by mouth every 6 (six) hours as needed.    I personally performed the services described in this documentation, which was scribed in my presence. The recorded information has been reviewed and is accurate.    Jarrett Soho Denzal Meir, PA-C 11/16/16 [place], MD 11/16/16 [place], MD 11/16/16 [phone]

## 2016-11-15 NOTE — Telephone Encounter (Signed)
I would be concerned about the possibility of acute appendicitis (kidney stone less likely). He should go to the ER ASAP for stat evaluation including CT scan and urinalysis

## 2016-11-15 NOTE — Telephone Encounter (Signed)
Spoke with pt and he is aware and verbalized understanding. 

## 2016-11-15 NOTE — Telephone Encounter (Signed)
Pt states that 2 days ago he noticed a pain in his LRQ but he ignored it. He states that now this pain is worse and it is a sharp pain. States that when he bends over to the left it hurts, when he sits down he feels it, if he lifts his knee up he has the pain and if he lays down and lifts his leg up he feels it. Reports that during the night last night he felt the pain.  Pt states that when he pushes on the LRQ it feels harder on the right side than the left. Pt not sure what the pain is from and wanted to run it by Dr. Henrene Pastor. Please advise.

## 2016-11-15 NOTE — Telephone Encounter (Signed)
Reviewed last CT report with pt and there is no mention of appendix being present or absent. Past surgical history does not mention appendectomy. Pt states he is on the way to the ER now to be evaluated.

## 2016-11-16 ENCOUNTER — Telehealth: Payer: Self-pay | Admitting: Internal Medicine

## 2016-11-16 ENCOUNTER — Encounter (HOSPITAL_COMMUNITY): Payer: Self-pay

## 2016-11-16 DIAGNOSIS — R110 Nausea: Secondary | ICD-10-CM | POA: Diagnosis not present

## 2016-11-16 DIAGNOSIS — R1900 Intra-abdominal and pelvic swelling, mass and lump, unspecified site: Secondary | ICD-10-CM | POA: Diagnosis not present

## 2016-11-16 DIAGNOSIS — R1903 Right lower quadrant abdominal swelling, mass and lump: Secondary | ICD-10-CM | POA: Diagnosis not present

## 2016-11-16 DIAGNOSIS — R1031 Right lower quadrant pain: Secondary | ICD-10-CM | POA: Diagnosis not present

## 2016-11-16 MED ORDER — OXYCODONE-ACETAMINOPHEN 5-325 MG PO TABS: 1.0000 | ORAL_TABLET | Freq: Four times a day (QID) | ORAL | 0 refills | Status: DC | PRN

## 2016-11-16 MED ORDER — ONDANSETRON 8 MG PO TBDP: ORAL_TABLET | ORAL | 0 refills | Status: DC

## 2016-11-16 MED ORDER — IOPAMIDOL (ISOVUE-300) INJECTION 61%
INTRAVENOUS | Status: AC
  Administered 2016-11-16: 100 mL via INTRAVENOUS
  Filled 2016-11-16: qty 100

## 2016-11-16 MED ORDER — OXYCODONE-ACETAMINOPHEN 5-325 MG PO TABS
2.0000 | ORAL_TABLET | Freq: Once | ORAL | Status: AC
  Administered 2016-11-16: 2 via ORAL
  Filled 2016-11-16: qty 2

## 2016-11-16 MED ORDER — MORPHINE SULFATE (PF) 2 MG/ML IV SOLN
4.0000 mg | Freq: Once | INTRAVENOUS | Status: AC
  Administered 2016-11-16: 4 mg via INTRAVENOUS
  Filled 2016-11-16: qty 2

## 2016-11-16 MED ORDER — IOPAMIDOL (ISOVUE-300) INJECTION 61%
100.0000 mL | Freq: Once | INTRAVENOUS | Status: AC | PRN
  Administered 2016-11-16: 100 mL via INTRAVENOUS

## 2016-11-16 NOTE — ED Notes (Signed)
Pt. Given water for fluid challenge  

## 2016-11-16 NOTE — Discharge Instructions (Signed)
1. Medications: zofran, percocet, stool softeners with each narcotic tablet, usual home medications 2. Treatment: rest, drink plenty of fluids, advance diet slowly 3. Follow Up: Please followup with your primary doctor in 2 days for discussion of your diagnoses and further evaluation after today's visit; if you do not have a primary care doctor use the resource guide provided to find one; Please return to the ER for persistent vomiting, high fevers or worsening symptoms

## 2016-11-16 NOTE — Telephone Encounter (Signed)
Received call from Dr. Dossie Der' office. They will see pt on Monday 11/20/16 at noon. Pt to take copy of CT to appt.  Spoke with Tedra Coupe in the file room to have CT placed on disc to pick up tomorrow. Pt aware. Discussed with pt that if he starts having increased pain and does not feel he can wait until appt on Monday to go to the [facility] ER. Pt will be seen at [facility]:  [place], [place]. Pt aware.

## 2016-11-16 NOTE — Telephone Encounter (Signed)
Pt with history of gardner's syndrome, had colectomy at age 31. Pt started having pain 2 days ago. Went to ER yesterday and has large mass in the abdomen. Spoke with Dr. Henrene Pastor by phone and called office of Dr. Selinda Eon for asap appt. Left message on [facility] RN voicemail for asap appt. Waiting for phone call back. Pt aware that we are working on getting appt.

## 2016-11-16 NOTE — ED Notes (Signed)
Patient transported to CT 

## 2016-11-16 NOTE — Telephone Encounter (Signed)
Spoke with pt and he is aware we are working on referral to Dr. Dossie Der. Records have been faxed to [phone]. Left message with Sharyn Creamer, RN at [phone] to call back regarding asap appt.

## 2016-11-17 DIAGNOSIS — R1900 Intra-abdominal and pelvic swelling, mass and lump, unspecified site: Secondary | ICD-10-CM | POA: Diagnosis not present

## 2016-11-17 DIAGNOSIS — R1903 Right lower quadrant abdominal swelling, mass and lump: Secondary | ICD-10-CM | POA: Diagnosis not present

## 2016-11-20 DIAGNOSIS — R1903 Right lower quadrant abdominal swelling, mass and lump: Secondary | ICD-10-CM | POA: Diagnosis not present

## 2016-11-28 DIAGNOSIS — D481 Neoplasm of uncertain behavior of connective and other soft tissue: Secondary | ICD-10-CM | POA: Diagnosis not present

## 2016-11-28 DIAGNOSIS — M729 Fibroblastic disorder, unspecified: Secondary | ICD-10-CM | POA: Diagnosis not present

## 2016-11-28 DIAGNOSIS — R1900 Intra-abdominal and pelvic swelling, mass and lump, unspecified site: Secondary | ICD-10-CM | POA: Diagnosis not present

## 2016-11-30 DIAGNOSIS — G893 Neoplasm related pain (acute) (chronic): Secondary | ICD-10-CM | POA: Diagnosis not present

## 2016-11-30 DIAGNOSIS — R1903 Right lower quadrant abdominal swelling, mass and lump: Secondary | ICD-10-CM | POA: Diagnosis not present

## 2016-12-04 DIAGNOSIS — Z515 Encounter for palliative care: Secondary | ICD-10-CM | POA: Diagnosis not present

## 2016-12-04 DIAGNOSIS — Z0289 Encounter for other administrative examinations: Secondary | ICD-10-CM | POA: Diagnosis not present

## 2016-12-04 DIAGNOSIS — K5903 Drug induced constipation: Secondary | ICD-10-CM | POA: Diagnosis not present

## 2016-12-04 DIAGNOSIS — T402X5A Adverse effect of other opioids, initial encounter: Secondary | ICD-10-CM | POA: Diagnosis not present

## 2016-12-04 DIAGNOSIS — R1031 Right lower quadrant pain: Secondary | ICD-10-CM | POA: Diagnosis not present

## 2016-12-04 DIAGNOSIS — M792 Neuralgia and neuritis, unspecified: Secondary | ICD-10-CM | POA: Diagnosis not present

## 2016-12-04 DIAGNOSIS — R1903 Right lower quadrant abdominal swelling, mass and lump: Secondary | ICD-10-CM | POA: Diagnosis not present

## 2016-12-07 DIAGNOSIS — G893 Neoplasm related pain (acute) (chronic): Secondary | ICD-10-CM | POA: Diagnosis not present

## 2016-12-07 DIAGNOSIS — R1031 Right lower quadrant pain: Secondary | ICD-10-CM | POA: Diagnosis not present

## 2016-12-07 DIAGNOSIS — Z5181 Encounter for therapeutic drug level monitoring: Secondary | ICD-10-CM | POA: Diagnosis not present

## 2016-12-07 DIAGNOSIS — Z79899 Other long term (current) drug therapy: Secondary | ICD-10-CM | POA: Diagnosis not present

## 2016-12-07 DIAGNOSIS — D481 Neoplasm of uncertain behavior of connective and other soft tissue: Secondary | ICD-10-CM | POA: Diagnosis not present

## 2016-12-25 DIAGNOSIS — T451X5A Adverse effect of antineoplastic and immunosuppressive drugs, initial encounter: Secondary | ICD-10-CM | POA: Diagnosis not present

## 2016-12-25 DIAGNOSIS — M792 Neuralgia and neuritis, unspecified: Secondary | ICD-10-CM | POA: Diagnosis not present

## 2016-12-25 DIAGNOSIS — R110 Nausea: Secondary | ICD-10-CM | POA: Diagnosis not present

## 2016-12-25 DIAGNOSIS — Z515 Encounter for palliative care: Secondary | ICD-10-CM | POA: Diagnosis not present

## 2016-12-25 DIAGNOSIS — R1031 Right lower quadrant pain: Secondary | ICD-10-CM | POA: Diagnosis not present

## 2016-12-27 DIAGNOSIS — Z79899 Other long term (current) drug therapy: Secondary | ICD-10-CM | POA: Diagnosis not present

## 2016-12-27 DIAGNOSIS — D481 Neoplasm of uncertain behavior of connective and other soft tissue: Secondary | ICD-10-CM | POA: Diagnosis not present

## 2017-01-22 DIAGNOSIS — Z515 Encounter for palliative care: Secondary | ICD-10-CM | POA: Diagnosis not present

## 2017-01-22 DIAGNOSIS — R110 Nausea: Secondary | ICD-10-CM | POA: Diagnosis not present

## 2017-01-22 DIAGNOSIS — R197 Diarrhea, unspecified: Secondary | ICD-10-CM | POA: Diagnosis not present

## 2017-01-22 DIAGNOSIS — M792 Neuralgia and neuritis, unspecified: Secondary | ICD-10-CM | POA: Diagnosis not present

## 2017-01-22 DIAGNOSIS — T451X5A Adverse effect of antineoplastic and immunosuppressive drugs, initial encounter: Secondary | ICD-10-CM | POA: Diagnosis not present

## 2017-02-07 DIAGNOSIS — R1903 Right lower quadrant abdominal swelling, mass and lump: Secondary | ICD-10-CM | POA: Diagnosis not present

## 2017-02-07 DIAGNOSIS — D481 Neoplasm of uncertain behavior of connective and other soft tissue: Secondary | ICD-10-CM | POA: Diagnosis not present

## 2017-02-19 DIAGNOSIS — Z515 Encounter for palliative care: Secondary | ICD-10-CM | POA: Diagnosis not present

## 2017-02-19 DIAGNOSIS — R1031 Right lower quadrant pain: Secondary | ICD-10-CM | POA: Diagnosis not present

## 2017-02-19 DIAGNOSIS — R5383 Other fatigue: Secondary | ICD-10-CM | POA: Diagnosis not present

## 2017-02-19 DIAGNOSIS — R1011 Right upper quadrant pain: Secondary | ICD-10-CM | POA: Diagnosis not present

## 2017-02-19 DIAGNOSIS — R110 Nausea: Secondary | ICD-10-CM | POA: Diagnosis not present

## 2017-03-06 ENCOUNTER — Telehealth: Payer: Self-pay | Admitting: *Deleted

## 2017-03-06 NOTE — Telephone Encounter (Signed)
Unable to leave a message due to voicemail being full. 

## 2017-03-06 NOTE — Telephone Encounter (Signed)
-----   Message from Lucretia Kern, DO sent at 02/28/2017 10:39 AM EDT ----- Let him know we are thinking about him and praying for him Wendie Simmer, is seeing onc at [facility] for large abdominal mass treatment - has been quite sick.) Looks like missed his thyroid US. Does he want to reschedule this? Thanks.  ----- Message ----- From: SYSTEM Sent: 02/23/2017  12:06 AM To: Lucretia Kern, DO

## 2017-03-14 ENCOUNTER — Telehealth: Payer: Self-pay | Admitting: Family Medicine

## 2017-03-14 NOTE — Telephone Encounter (Signed)
Pt would like to thank you for the sweetest get well note he has ever had.   He states you are the best dr in the world. :)

## 2017-03-20 ENCOUNTER — Telehealth: Payer: Self-pay | Admitting: *Deleted

## 2017-03-20 DIAGNOSIS — Q8789 Other specified congenital malformation syndromes, not elsewhere classified: Secondary | ICD-10-CM

## 2017-03-20 DIAGNOSIS — G893 Neoplasm related pain (acute) (chronic): Secondary | ICD-10-CM | POA: Diagnosis not present

## 2017-03-20 DIAGNOSIS — R530 Neoplastic (malignant) related fatigue: Secondary | ICD-10-CM | POA: Diagnosis not present

## 2017-03-20 DIAGNOSIS — M792 Neuralgia and neuritis, unspecified: Secondary | ICD-10-CM | POA: Diagnosis not present

## 2017-03-20 DIAGNOSIS — D126 Benign neoplasm of colon, unspecified: Principal | ICD-10-CM

## 2017-03-20 DIAGNOSIS — Z515 Encounter for palliative care: Secondary | ICD-10-CM | POA: Diagnosis not present

## 2017-03-20 NOTE — Telephone Encounter (Signed)
I called the pt and informed him of the message below and he stated he has had a lot going on and requested the test be ordered again.  [place] reordered and the pt is aware someone will call with appt info.

## 2017-03-20 NOTE — Telephone Encounter (Signed)
-----   Message from Lucretia Kern, DO sent at 02/28/2017 10:39 AM EDT ----- Let him know we are thinking about him and praying for him Wendie Simmer, is seeing onc at [facility] for large abdominal mass treatment - has been quite sick.) Looks like missed his thyroid US. Does he want to reschedule this? Thanks.  ----- Message ----- From: SYSTEM Sent: 02/23/2017  12:06 AM To: Lucretia Kern, DO

## 2017-03-21 DIAGNOSIS — D481 Neoplasm of uncertain behavior of connective and other soft tissue: Secondary | ICD-10-CM | POA: Diagnosis not present

## 2017-03-21 DIAGNOSIS — K769 Liver disease, unspecified: Secondary | ICD-10-CM | POA: Diagnosis not present

## 2017-06-04 ENCOUNTER — Telehealth: Payer: Self-pay

## 2017-06-04 NOTE — Telephone Encounter (Signed)
Lm on vm that needed to schedule egd at hospital and previsit with nurse

## 2017-06-11 ENCOUNTER — Telehealth: Payer: Self-pay

## 2017-06-11 NOTE — Telephone Encounter (Signed)
Lm on vm that I had already scheduled his egd at the hospital but needed to schedule a previsit.

## 2017-06-19 DIAGNOSIS — M792 Neuralgia and neuritis, unspecified: Secondary | ICD-10-CM | POA: Diagnosis not present

## 2017-06-19 DIAGNOSIS — R530 Neoplastic (malignant) related fatigue: Secondary | ICD-10-CM | POA: Diagnosis not present

## 2017-06-19 DIAGNOSIS — Z515 Encounter for palliative care: Secondary | ICD-10-CM | POA: Diagnosis not present

## 2017-06-20 DIAGNOSIS — D481 Neoplasm of uncertain behavior of connective and other soft tissue: Secondary | ICD-10-CM | POA: Diagnosis not present

## 2017-06-20 DIAGNOSIS — R1903 Right lower quadrant abdominal swelling, mass and lump: Secondary | ICD-10-CM | POA: Diagnosis not present

## 2017-07-31 ENCOUNTER — Encounter (HOSPITAL_COMMUNITY): Payer: Self-pay

## 2017-07-31 ENCOUNTER — Ambulatory Visit (HOSPITAL_COMMUNITY): Admit: 2017-07-31 | Payer: BLUE CROSS/BLUE SHIELD | Admitting: Internal Medicine

## 2017-07-31 SURGERY — EGD (ESOPHAGOGASTRODUODENOSCOPY)
Anesthesia: Monitor Anesthesia Care

## 2017-09-10 DIAGNOSIS — Z515 Encounter for palliative care: Secondary | ICD-10-CM | POA: Diagnosis not present

## 2017-09-10 DIAGNOSIS — R112 Nausea with vomiting, unspecified: Secondary | ICD-10-CM | POA: Diagnosis not present

## 2017-09-10 DIAGNOSIS — T451X5A Adverse effect of antineoplastic and immunosuppressive drugs, initial encounter: Secondary | ICD-10-CM | POA: Diagnosis not present

## 2017-09-10 DIAGNOSIS — M792 Neuralgia and neuritis, unspecified: Secondary | ICD-10-CM | POA: Diagnosis not present

## 2017-09-10 DIAGNOSIS — G893 Neoplasm related pain (acute) (chronic): Secondary | ICD-10-CM | POA: Diagnosis not present

## 2017-10-18 DIAGNOSIS — D481 Neoplasm of uncertain behavior of connective and other soft tissue: Secondary | ICD-10-CM | POA: Diagnosis not present

## 2017-10-18 DIAGNOSIS — T50905A Adverse effect of unspecified drugs, medicaments and biological substances, initial encounter: Secondary | ICD-10-CM | POA: Diagnosis not present

## 2017-10-18 DIAGNOSIS — G893 Neoplasm related pain (acute) (chronic): Secondary | ICD-10-CM | POA: Diagnosis not present

## 2017-10-18 DIAGNOSIS — R112 Nausea with vomiting, unspecified: Secondary | ICD-10-CM | POA: Diagnosis not present

## 2017-12-17 DIAGNOSIS — R110 Nausea: Secondary | ICD-10-CM | POA: Diagnosis not present

## 2017-12-17 DIAGNOSIS — Z515 Encounter for palliative care: Secondary | ICD-10-CM | POA: Diagnosis not present

## 2017-12-17 DIAGNOSIS — G893 Neoplasm related pain (acute) (chronic): Secondary | ICD-10-CM | POA: Diagnosis not present

## 2017-12-17 DIAGNOSIS — T451X5A Adverse effect of antineoplastic and immunosuppressive drugs, initial encounter: Secondary | ICD-10-CM | POA: Diagnosis not present

## 2017-12-17 DIAGNOSIS — M792 Neuralgia and neuritis, unspecified: Secondary | ICD-10-CM | POA: Diagnosis not present

## 2017-12-24 DIAGNOSIS — R1903 Right lower quadrant abdominal swelling, mass and lump: Secondary | ICD-10-CM | POA: Diagnosis not present

## 2018-01-04 DIAGNOSIS — D481 Neoplasm of uncertain behavior of connective and other soft tissue: Secondary | ICD-10-CM | POA: Diagnosis not present

## 2018-01-04 DIAGNOSIS — D214 Benign neoplasm of connective and other soft tissue of abdomen: Secondary | ICD-10-CM | POA: Diagnosis not present

## 2018-01-04 DIAGNOSIS — R1909 Other intra-abdominal and pelvic swelling, mass and lump: Secondary | ICD-10-CM | POA: Diagnosis not present

## 2018-02-21 DIAGNOSIS — D481 Neoplasm of uncertain behavior of connective and other soft tissue: Secondary | ICD-10-CM | POA: Diagnosis not present

## 2018-03-11 ENCOUNTER — Telehealth: Payer: Self-pay | Admitting: Internal Medicine

## 2018-03-11 NOTE — Telephone Encounter (Signed)
Pt called and wants to see Dr. Henrene Pastor. 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 He thought they would do his EGD also but that has not been done. Pt states he is past due for an EGD. Please advise if pt needs to be seen for an OV first or a direct. Please advise.

## 2018-03-12 NOTE — Telephone Encounter (Signed)
[facility] records in your inbox for review.

## 2018-03-12 NOTE — Telephone Encounter (Signed)
Left message for pt to call back  °

## 2018-03-12 NOTE — Telephone Encounter (Signed)
Okay. Set him up to see me in the office. Thanks

## 2018-03-12 NOTE — Telephone Encounter (Signed)
Get copies of relevant records from [place] to see where he is at with his disease and if they want him to have an upper endoscopy. If so, I will see him in the office arrange scheduling at that time. Thanks

## 2018-03-14 ENCOUNTER — Encounter: Payer: Self-pay | Admitting: *Deleted

## 2018-03-14 DIAGNOSIS — M792 Neuralgia and neuritis, unspecified: Secondary | ICD-10-CM | POA: Diagnosis not present

## 2018-03-14 DIAGNOSIS — R110 Nausea: Secondary | ICD-10-CM | POA: Diagnosis not present

## 2018-03-14 DIAGNOSIS — T451X5A Adverse effect of antineoplastic and immunosuppressive drugs, initial encounter: Secondary | ICD-10-CM | POA: Diagnosis not present

## 2018-03-14 DIAGNOSIS — G893 Neoplasm related pain (acute) (chronic): Secondary | ICD-10-CM | POA: Diagnosis not present

## 2018-03-14 DIAGNOSIS — G4701 Insomnia due to medical condition: Secondary | ICD-10-CM | POA: Diagnosis not present

## 2018-03-14 DIAGNOSIS — Z515 Encounter for palliative care: Secondary | ICD-10-CM | POA: Diagnosis not present

## 2018-03-14 NOTE — Telephone Encounter (Signed)
Pt scheduled to see Dr. Henrene Pastor tomorrow at [facility] [place], pt aware.

## 2018-03-15 ENCOUNTER — Ambulatory Visit: Payer: BLUE CROSS/BLUE SHIELD | Admitting: Internal Medicine

## 2018-03-15 ENCOUNTER — Telehealth: Payer: Self-pay | Admitting: Internal Medicine

## 2018-03-15 NOTE — Telephone Encounter (Signed)
Noted  

## 2018-03-18 ENCOUNTER — Emergency Department (INDEPENDENT_AMBULATORY_CARE_PROVIDER_SITE_OTHER): Payer: BLUE CROSS/BLUE SHIELD

## 2018-03-18 ENCOUNTER — Other Ambulatory Visit: Payer: Self-pay

## 2018-03-18 ENCOUNTER — Ambulatory Visit (HOSPITAL_BASED_OUTPATIENT_CLINIC_OR_DEPARTMENT_OTHER)
Admit: 2018-03-18 | Discharge: 2018-03-18 | Disposition: A | Discharge status: Home / Self Care | Payer: BLUE CROSS/BLUE SHIELD | Attending: Family Medicine | Admitting: Family Medicine

## 2018-03-18 ENCOUNTER — Emergency Department
Admission: EM | Admit: 2018-03-18 | Discharge: 2018-03-18 | Disposition: A | Discharge status: Home / Self Care | Payer: BLUE CROSS/BLUE SHIELD | Source: Home / Self Care | Attending: Family Medicine | Admitting: Family Medicine

## 2018-03-18 ENCOUNTER — Encounter: Payer: Self-pay | Admitting: *Deleted

## 2018-03-18 DIAGNOSIS — M25531 Pain in right wrist: Secondary | ICD-10-CM | POA: Diagnosis not present

## 2018-03-18 NOTE — ED Triage Notes (Signed)
Patient c/o right wrist pain x 2 days without known injury. Worse this AM. He works on a computer often. Taken 1 oxycodone without relief.

## 2018-03-18 NOTE — ED Provider Notes (Signed)
[facility]    CSN: [id number] Arrival date & time: 03/18/18  1405     History   Chief Complaint Chief Complaint  Patient presents with  . Wrist Pain    HPI Jonathan Marsh is a 32 y.o. male.   HPI  Jonathan Marsh is a 32 y.o. male presenting to UC with c/o Right wrist pain for 2 days after he believes he slept on it wrong the other day. He does work on Radio [employer] often. Denies known injury.  He has take oxycodone but no relief.  He is also on cymbalta and lyrica due to pain from stomach cancer but that does not help his wrist pain. He is Right hand dominant. Pain is worse in the morning and worse with movement, aching and throbbing, moderate to severe.     Past Medical History:  Diagnosis Date  . Ampullary adenoma   . Desmoid tumor of abdomen   . Gardner syndrome   . Gastric polyp   . GERD (gastroesophageal reflux disease)   . Headache    mild - stress related  . Multiple gastric polyps   . Polyp of duodenum     Patient Active Problem List   Diagnosis Date Noted  . Adenomatous duodenal polyp   . Ampullary adenoma   . Multiple gastric polyps   . 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 Stress headaches 08/18/2014  . GAD (generalized anxiety disorder) 08/18/2014    Past Surgical History:  Procedure Laterality Date  . APPENDECTOMY    . COLECTOMY     age 43 due to gardners syndrome- has j pouch reconstruction  . COLONOSCOPY    . ESOPHAGOGASTRODUODENOSCOPY N/A 03/30/2015   Procedure: ESOPHAGOGASTRODUODENOSCOPY (EGD);  Surgeon: Irene Shipper, MD;  Location: [facility];  Service: Endoscopy;  Laterality: N/A;  need side viewing scope  . ESOPHAGOGASTRODUODENOSCOPY N/A 01/18/2016   Procedure: ESOPHAGOGASTRODUODENOSCOPY (EGD);  Surgeon: Irene Shipper, MD;  Location: [facility];  Service: Endoscopy;  Laterality: N/A;   use ercp scope   . UPPER GASTROINTESTINAL ENDOSCOPY         Home Medications    Prior to Admission medications   Medication Sig Start Date End Date  Taking? Authorizing Provider  DULoxetine (CYMBALTA) 60 MG capsule Take by mouth. 07/02/17 03/14/19 Yes [provider]  OLANZapine (ZYPREXA) 10 MG tablet Take by mouth.   Yes [provider]  oxyCODONE 30 MG 12 hr tablet Take by mouth. 03/06/18  Yes [provider]  pregabalin (LYRICA) 50 MG capsule Take by mouth. 06/19/17  Yes [provider]  SORAfenib (NEXAVAR) 200 MG tablet TAKE 2 TABLETS (400MG ) BY MOUTH DAILY. TAKE ON AN EMPTY STOMACH. AVOIDGRAPEFRUIT PRODUCTS. 12/11/16  Yes [provider]  celecoxib (CELEBREX) 200 MG capsule  03/06/18   [provider]  Melatonin 5 MG TABS Take 1 tablet by mouth at bedtime as needed (sleep).    [provider]    Family History Family History  Problem Relation Age of Onset  . Cancer Mother        GI cancer  . Drug abuse Mother   . Colon polyps Mother   . Other Mother        Gardners syndrome  . Lung cancer Maternal Grandmother   . Colon cancer Maternal Grandfather   . Colon polyps Maternal Grandfather   . Other Maternal Grandfather        Gardners syndrome  . Brain cancer Paternal Grandmother   . Liver cancer Paternal Grandmother  Social History Social History   Tobacco Use  . Smoking status: Former Smoker    Types: Cigarettes    Last attempt to quit: 01/01/2008    Years since quitting: 10.2  . Smokeless tobacco: Never Used  Substance Use Topics  . Alcohol use: No    Alcohol/week: 0.0 oz  . Drug use: No     Allergies   Ondansetron hcl   Review of Systems Review of Systems  Musculoskeletal: Positive for arthralgias, joint swelling and myalgias.  Skin: Negative for color change, rash and wound.  Neurological: Positive for weakness (right hand/wrist due to pain). Negative for numbness.     Physical Exam Triage Vital Signs ED Triage Vitals  Enc Vitals Group     BP 03/18/18 1432 112/77     Pulse Rate 03/18/18 1432 84     Resp 03/18/18 1432 14     Temp --       Temp src --      SpO2 03/18/18 1432 95 %     Weight 03/18/18 1433 280 lb (127 kg)     Height 03/18/18 1433 6\' 6"  (1.981 m)     Head Circumference --      Peak Flow --      Pain Score 03/18/18 1433 8     Pain Loc --      Pain Edu? --      Excl. in [place]? --    No data found.  Updated Vital Signs BP 112/77 (BP Location: Right Arm)   Pulse 84   Resp 14   Ht 6\' 6"  (1.981 m)   Wt 280 lb (127 kg)   SpO2 95%   BMI 32.36 kg/m   Visual Acuity Right Eye Distance:   Left Eye Distance:   Bilateral Distance:    Right Eye Near:   Left Eye Near:    Bilateral Near:     Physical Exam  Constitutional: He is oriented to person, place, and time. He appears well-developed and well-nourished.  HENT:  Head: Normocephalic and atraumatic.  Eyes: EOM are normal.  Neck: Normal range of motion.  Cardiovascular: Normal rate.  Pulses:      Radial pulses are 2+ on the right side.  Pulmonary/Chest: Effort normal.  Musculoskeletal: He exhibits edema and tenderness.  Right wrist: mild edema. No deformity. Limited ROM due to pain with pronation and supination and wrist extension.  4/5 grip strength compared to Left. No snuffbox tenderness.   Neurological: He is alert and oriented to person, place, and time.  Skin: Skin is warm and dry. Capillary refill takes less than 2 seconds.  Right wrist and hand: skin in tact. No ecchymosis or erythema.   Psychiatric: He has a normal mood and affect. His behavior is normal.  Nursing note and vitals reviewed.    UC Treatments / Results  Labs (all labs ordered are listed, but only abnormal results are displayed) Labs Reviewed - No data to display  EKG None  Radiology Dg Wrist Complete Right  Result Date: 03/18/2018 CLINICAL DATA:  Pain for 1 day. Decreased range of motion. No known injury. EXAM: RIGHT WRIST - COMPLETE 3+ VIEW COMPARISON:  None. FINDINGS: There is a probable bone island in the ulna. No fracture or dislocation. No degenerative changes. No  cause for pain identified. IMPRESSION: Negative. Electronically Signed   By: Dorise Bullion III M.D   On: 03/18/2018 15:07   US Venous Img Upper Uni Right  Result Date: 03/18/2018 CLINICAL DATA:  Right wrist pain EXAM: RIGHT UPPER EXTREMITY VENOUS DUPLEX ULTRASOUND TECHNIQUE: Doppler venous assessment of the right upper extremity deep venous system was performed, including characterization of spectral flow, compressibility, and phasicity. COMPARISON:  None. FINDINGS: Right jugular vein is compressible. It is widely patent by color Doppler imaging. Doppler analysis demonstrates a normal-appearing venous waveform. The right subclavian vein is also patent by color Doppler imaging. Doppler analysis demonstrates a normal-appearing venous waveform with phasicity. There is complete compressibility of the right axillary, brachial, radial, and ulnar veins. Doppler analysis demonstrates phasicity and augmentation with wrist compression. There is no evidence of superficial vein thrombosis in the right arm or forearm. IMPRESSION: No evidence of right upper extremity DVT. Electronically Signed   By: Marybelle Killings M.D.   On: 03/18/2018 18:14    Procedures Procedures (including critical care time)  Medications Ordered in UC Medications - No data to display  Initial Impression / Assessment and Plan / UC Course  I have reviewed the triage vital signs and the nursing notes.  Pertinent labs & imaging results that were available during my care of the patient were reviewed by me and considered in my medical decision making (see chart for details).     No cause of pt's pain found via imaging Pt given wrist splint Encouraged to f/u with Sports Medicine.  Final Clinical Impressions(s) / UC Diagnoses   Final diagnoses:  Wrist pain, right     Discharge Instructions      Please go to [place] for an ultrasound of your Left arm.  Your appointment is at [facility] but it is recommended you get there at  least 10-15 minutes sooner in case there is any paperwork needed to be filled out.  Once you have your ultrasound complete, you may wait in the waiting room and we will speak with you over the phone to provide the results or you may leave and we can call your cell phone.  Our office closes at 8PM this evening but we should have results back well before then.   If pain not improving with wrist splint, please call to schedule a follow up appointment with Sports Medicine.     ED Prescriptions    None     Controlled Substance Prescriptions [place] Controlled Substance Registry consulted? Not Applicable   Tyrell Antonio 03/19/18 8413

## 2018-03-18 NOTE — Discharge Instructions (Signed)
°  Please go to [place] for an ultrasound of your Left arm.  Your appointment is at [facility] but it is recommended you get there at least 10-15 minutes sooner in case there is any paperwork needed to be filled out.  Once you have your ultrasound complete, you may wait in the waiting room and we will speak with you over the phone to provide the results or you may leave and we can call your cell phone.  Our office closes at 8PM this evening but we should have results back well before then.   If pain not improving with wrist splint, please call to schedule a follow up appointment with Sports Medicine.

## 2018-03-19 ENCOUNTER — Telehealth: Payer: Self-pay | Admitting: Emergency Medicine

## 2018-03-19 NOTE — Telephone Encounter (Signed)
Patient called wanting results of wrist ultrasound; gave them per [facility]; he is still in pain with limited mobility and advised him to set up appt. With Sports Medicine for follow up.

## 2018-04-30 ENCOUNTER — Encounter: Payer: Self-pay | Admitting: Family Medicine

## 2018-04-30 ENCOUNTER — Ambulatory Visit (INDEPENDENT_AMBULATORY_CARE_PROVIDER_SITE_OTHER): Payer: BLUE CROSS/BLUE SHIELD | Admitting: Family Medicine

## 2018-04-30 VITALS — BP 98/70 | HR 90 | Temp 98.3°F | Ht 78.0 in | Wt 293.8 lb

## 2018-04-30 DIAGNOSIS — R234 Changes in skin texture: Secondary | ICD-10-CM

## 2018-04-30 DIAGNOSIS — F419 Anxiety disorder, unspecified: Secondary | ICD-10-CM | POA: Diagnosis not present

## 2018-04-30 DIAGNOSIS — F331 Major depressive disorder, recurrent, moderate: Secondary | ICD-10-CM

## 2018-04-30 DIAGNOSIS — R6884 Jaw pain: Secondary | ICD-10-CM

## 2018-04-30 NOTE — Progress Notes (Signed)
HPI:  Using dictation device. Unfortunately this device frequently misinterprets words/phrases.  Jonathan Marsh is a very pleasant 32 year old here for an acute visit for several issues.  He is currently seeing a team of oncologist and radiologists for a large desmoid tumor.  He is currently on chemotherapy for this.  Unfortunately, he has not been able to get out, exercise as much during these treatments.  He also has had a lot of pain.  He has started to suffer some depression which she reports is been ongoing for at least 4 to 5 months.  Reports this daily feeling down, feeling unmotivated, some anxiety.  Denies severe depression, thoughts of harm, hallucinations, panic or manic symptoms.  He prefers to not take any more medication for this.  He wanted my recommendations regarding other types of therapy. He also has a new acute issue of some left jaw pain.  This started yesterday.  Feels like his jaw needs to pop.  Denies catching, locking tenesmus, fevers, malaise, swelling, redness dental problems or sinus symptoms. He also has a new concern of peeling on the feet.  He is seeing his oncologist about this.  The chemotherapy agent he is taking can cause this and his oncologist recommended a cream for his feet.  He wears flip-flops most of the time.  Gets some blisters and then they peel and can be painful.   ROS: See pertinent positives and negatives per HPI.  Past Medical History:  Diagnosis Date  . Ampullary adenoma   . Desmoid tumor of abdomen   . Gardner syndrome   . Gastric polyp   . GERD (gastroesophageal reflux disease)   . Headache    mild - stress related  . Multiple gastric polyps   . Polyp of duodenum     Past Surgical History:  Procedure Laterality Date  . APPENDECTOMY    . COLECTOMY     age 35 due to gardners syndrome- has j pouch reconstruction  . COLONOSCOPY    . ESOPHAGOGASTRODUODENOSCOPY N/A 03/30/2015   Procedure: ESOPHAGOGASTRODUODENOSCOPY (EGD);  Surgeon: Irene Shipper, MD;  Location: [facility];  Service: Endoscopy;  Laterality: N/A;  need side viewing scope  . ESOPHAGOGASTRODUODENOSCOPY N/A 01/18/2016   Procedure: ESOPHAGOGASTRODUODENOSCOPY (EGD);  Surgeon: Irene Shipper, MD;  Location: [facility];  Service: Endoscopy;  Laterality: N/A;   use ercp scope   . UPPER GASTROINTESTINAL ENDOSCOPY      Family History  Problem Relation Age of Onset  . Cancer Mother        GI cancer  . Drug abuse Mother   . Colon polyps Mother   . Other Mother        Gardners syndrome  . Lung cancer Maternal Grandmother   . Colon cancer Maternal Grandfather   . Colon polyps Maternal Grandfather   . Other Maternal Grandfather        Gardners syndrome  . Brain cancer Paternal Grandmother   . Liver cancer Paternal Grandmother     SOCIAL HX: See HPI   Current Outpatient Medications:  .  celecoxib (CELEBREX) 200 MG capsule, , Disp: , Rfl: 1 .  DULoxetine (CYMBALTA) 60 MG capsule, Take by mouth., Disp: , Rfl:  .  Melatonin 5 MG TABS, Take 1 tablet by mouth at bedtime as needed (sleep)., Disp: , Rfl:  .  OLANZapine (ZYPREXA) 10 MG tablet, Take by mouth., Disp: , Rfl:  .  oxyCODONE 30 MG 12 hr tablet, Take by mouth., Disp: , Rfl:  .  oxyCODONE-acetaminophen (PERCOCET/ROXICET) 5-325 MG tablet, Take by mouth., Disp: , Rfl:  .  pregabalin (LYRICA) 100 MG capsule, Take by mouth 2 (two) times daily. , Disp: , Rfl: 1 .  SORAfenib (NEXAVAR) 200 MG tablet, TAKE 2 TABLETS (400MG ) BY MOUTH DAILY. TAKE ON AN EMPTY STOMACH. AVOIDGRAPEFRUIT PRODUCTS., Disp: , Rfl:  .  TRAZODONE HCL PO, Take by mouth at bedtime as needed., Disp: , Rfl:   EXAM:  Vitals:   04/30/18 0950  BP: 98/70  Pulse: 90  Temp: 98.3 F (36.8 C)  SpO2: 97%    Body mass index is 33.95 kg/m.  GENERAL: vitals reviewed and listed above, alert, oriented, appears well hydrated and in no acute distress  HEENT: atraumatic, conjunttiva clear, no obvious abnormalities on inspection of external nose and ears, I  do not see any abnormalities of the jaw on inspection, I also do not palpate any masses or redness.  He does have mild deviation of the jaw and mild clicking with opening and shutting the jaw he has some tenderness to palpation over the muscles of mastication the left and over the TMJ joint.  No significant hygiene issues or sinus issues on exam.  His ear canals and tympanic membranes appear normal today.  NECK: no obvious masses on inspection  LUNGS: clear to auscultation bilaterally, no wheezes, rales or rhonchi, good air movement  CV: HRRR, no peripheral edema  SKIN: Some callus formation on the heels and anterior feet  MS: moves all extremities without noticeable abnormality  PSYCH: pleasant and cooperative, no obvious depression or anxiety  ASSESSMENT AND PLAN:  Discussed the following assessment and plan:  Moderate episode of recurrent major depressive disorder ([place]) Anxiety -Discussed treatment options, he prefers to avoid changes in medications -He has a lot going on and I think that being plugged in with a psychologist could be very helpful -He would like to see a psychologist through our practice, number provided for him to call, he is interested in seeing Dennison Bulla  Jaw pain -Suspect TMJ, but did advise given his history follow-up promptly if not responding to muscle relaxer, soft foods, TMJ exercises and heat.  If not responding in 1 week, did recommend will need imaging or we will place a referral to ENT.  Peeling skin -He is seeing his oncologist for this and reports it is a side effect to his chemotherapy agent.  I think that reflux may not be the best in terms of friction and dirt on the feet.  Suggested good foot wear, also a protective emollient such as Aquaphor.  Follow up 1 month  -Patient advised to return or notify a doctor immediately if symptoms worsen or persist or new concerns arise.  Patient Instructions  BEFORE YOU LEAVE: -follow up: 1 month  Try  the tizanidine (muscle relaxer) as needed per instructions, heat, the exercises and pain medication as needed for the jaw pain. Please call in 1 week if not resolving or sooner if worsening and we will place referral to ENT.  Please call the number provided to schedule Cognitive Behavioral Therapy for Depression with Dennison Bulla. His clinic is open at [place] starting in October.  Consider integrative medicine at [facility].  Ask oncology team if ok with flu shot if you would like to get it at your next visit.  Try good shoes, moisture wicking socks and Aquaphor for the feet.  I hope you are feeling better soon! Seek care promptly if your symptoms worsen, new concerns arise or you  are not improving with treatment.      Lucretia Kern, DO

## 2018-04-30 NOTE — Patient Instructions (Signed)
BEFORE YOU LEAVE: -follow up: 1 month  Try the tizanidine (muscle relaxer) as needed per instructions, heat, the exercises and pain medication as needed for the jaw pain. Please call in 1 week if not resolving or sooner if worsening and we will place referral to ENT.  Please call the number provided to schedule Cognitive Behavioral Therapy for Depression with Dennison Bulla. His clinic is open at [place] starting in October.  Consider integrative medicine at [facility].  Ask oncology team if ok with flu shot if you would like to get it at your next visit.  Try good shoes, moisture wicking socks and Aquaphor for the feet.  I hope you are feeling better soon! Seek care promptly if your symptoms worsen, new concerns arise or you are not improving with treatment.

## 2018-05-01 ENCOUNTER — Telehealth: Payer: Self-pay | Admitting: Family Medicine

## 2018-05-01 NOTE — Telephone Encounter (Signed)
Copied from [place] [id number]. Topic: Quick Communication - See Telephone Encounter >> May 01, 2018  3:31 PM Reyne Dumas L wrote: CRM for notification. See Telephone encounter for: 05/01/18.  Pt believes that a muscle relaxer was supposed to be called in for him after OV with Dr. Maudie Mercury, but the pharmacy isn't showing any medication has been sent in.  Pt is calling to check on this.  Pt uses [facility] [place] [facility] [phone] (Phone) [phone] (Fax)  PT can be reached at [phone]

## 2018-05-02 MED ORDER — TIZANIDINE HCL 2 MG PO TABS: 2.0000 mg | ORAL_TABLET | Freq: Three times a day (TID) | ORAL | 0 refills | Status: DC | PRN

## 2018-05-02 NOTE — Addendum Note (Signed)
Addended by: Lucretia Kern on: 05/02/2018 09:10 AM   Modules accepted: Orders

## 2018-05-02 NOTE — Telephone Encounter (Signed)
I called the pt and informed him of the message below. 

## 2018-05-02 NOTE — Telephone Encounter (Signed)
Please let him know - so sorry. Not sure why did not go thru and resent this morning.

## 2018-05-07 ENCOUNTER — Ambulatory Visit: Payer: BLUE CROSS/BLUE SHIELD | Admitting: Internal Medicine

## 2018-05-12 ENCOUNTER — Other Ambulatory Visit: Payer: Self-pay | Admitting: Family Medicine

## 2018-05-21 ENCOUNTER — Other Ambulatory Visit: Payer: Self-pay | Admitting: Family Medicine

## 2018-05-21 NOTE — Telephone Encounter (Signed)
I called [facility] and spoke with Cecille Rubin, she stated the Rx was received today, is ready for pick up and they will inform the pt.

## 2018-05-21 NOTE — Telephone Encounter (Signed)
Can you please check on the status of this refill.  I do not understand this message.  Ensure he has the refill he requested.  Thanks.

## 2018-05-30 ENCOUNTER — Ambulatory Visit: Payer: BLUE CROSS/BLUE SHIELD | Admitting: Family Medicine

## 2018-05-30 ENCOUNTER — Other Ambulatory Visit: Payer: Self-pay | Admitting: Family Medicine

## 2018-05-31 NOTE — Telephone Encounter (Signed)
If still having jaw issues would recommend ENT eval. Ok to refill # 15 in inteirm. Thanks.

## 2018-06-02 ENCOUNTER — Other Ambulatory Visit: Payer: Self-pay | Admitting: Family Medicine

## 2018-06-04 ENCOUNTER — Ambulatory Visit: Payer: BLUE CROSS/BLUE SHIELD | Admitting: Family Medicine

## 2018-06-04 DIAGNOSIS — Z0289 Encounter for other administrative examinations: Secondary | ICD-10-CM

## 2018-06-04 NOTE — Telephone Encounter (Signed)
I left a message for the pt to return my call.  CRM also created. 

## 2018-06-04 NOTE — Telephone Encounter (Signed)
Patient returned call due to call dropping in the middle of call

## 2018-06-04 NOTE — Telephone Encounter (Signed)
I called the pt back and he stated he does not need refills as he is feeling better.  Patient stated he forgot about his appt today and this was rescheduled.

## 2018-06-11 ENCOUNTER — Ambulatory Visit: Payer: BLUE CROSS/BLUE SHIELD | Admitting: Family Medicine

## 2018-06-17 ENCOUNTER — Ambulatory Visit: Payer: BLUE CROSS/BLUE SHIELD | Admitting: Internal Medicine

## 2018-06-18 ENCOUNTER — Ambulatory Visit (INDEPENDENT_AMBULATORY_CARE_PROVIDER_SITE_OTHER): Payer: BLUE CROSS/BLUE SHIELD | Admitting: Family Medicine

## 2018-06-18 ENCOUNTER — Encounter: Payer: Self-pay | Admitting: Family Medicine

## 2018-06-18 VITALS — BP 100/68 | HR 64 | Temp 98.3°F | Ht 78.0 in | Wt 296.6 lb

## 2018-06-18 DIAGNOSIS — F339 Major depressive disorder, recurrent, unspecified: Secondary | ICD-10-CM

## 2018-06-18 DIAGNOSIS — D126 Benign neoplasm of colon, unspecified: Secondary | ICD-10-CM | POA: Diagnosis not present

## 2018-06-18 DIAGNOSIS — R6884 Jaw pain: Secondary | ICD-10-CM

## 2018-06-18 DIAGNOSIS — D481 Neoplasm of uncertain behavior of connective and other soft tissue: Secondary | ICD-10-CM

## 2018-06-18 DIAGNOSIS — F411 Generalized anxiety disorder: Secondary | ICD-10-CM | POA: Diagnosis not present

## 2018-06-18 DIAGNOSIS — Q8789 Other specified congenital malformation syndromes, not elsewhere classified: Secondary | ICD-10-CM

## 2018-06-18 NOTE — Progress Notes (Signed)
HPI:  Using dictation device. Unfortunately this device frequently misinterprets words/phrases.  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  Number of medications for pain.  He was seen a month ago for some jaw pain.  Reports this resolved completely indications since for that.  He is seeing his oncologist soon to discuss other options for his pain related to this.  He sees a palliative care team for management of symptoms.  Plans to see therapy for CBT, has not done so yet.  He is considering alternative treatments at a clinic out in [place], this will be costly.  ROS: See pertinent positives and negatives per HPI.  Past Medical History:  Diagnosis Date  . Ampullary adenoma   . Desmoid tumor of abdomen   . Gardner syndrome   . Gastric polyp   . GERD (gastroesophageal reflux disease)   . Headache    mild - stress related  . Multiple gastric polyps   . Polyp of duodenum     Past Surgical History:  Procedure Laterality Date  . APPENDECTOMY    . COLECTOMY     age 16 due to gardners syndrome- has j pouch reconstruction  . COLONOSCOPY    . ESOPHAGOGASTRODUODENOSCOPY N/A 03/30/2015   Procedure: ESOPHAGOGASTRODUODENOSCOPY (EGD);  Surgeon: Irene Shipper, MD;  Location: [facility];  Service: Endoscopy;  Laterality: N/A;  need side viewing scope  . ESOPHAGOGASTRODUODENOSCOPY N/A 01/18/2016   Procedure: ESOPHAGOGASTRODUODENOSCOPY (EGD);  Surgeon: Irene Shipper, MD;  Location: [facility];  Service: Endoscopy;  Laterality: N/A;   use ercp scope   . UPPER GASTROINTESTINAL ENDOSCOPY      Family History  Problem Relation Age of Onset  . Cancer Mother        GI cancer  . Drug abuse Mother   . Colon polyps Mother   . Other Mother        Gardners syndrome  . Lung cancer Maternal Grandmother   . Colon cancer Maternal  Grandfather   . Colon polyps Maternal Grandfather   . Other Maternal Grandfather        Gardners syndrome  . Brain cancer Paternal Grandmother   . Liver cancer Paternal Grandmother     SOCIAL HX:see above   Current Outpatient Medications:  .  celecoxib (CELEBREX) 200 MG capsule, , Disp: , Rfl: 1 .  DULoxetine (CYMBALTA) 60 MG capsule, Take by mouth., Disp: , Rfl:  .  Melatonin 5 MG TABS, Take 1 tablet by mouth at bedtime as needed (sleep)., Disp: , Rfl:  .  OLANZapine (ZYPREXA) 10 MG tablet, Take by mouth., Disp: , Rfl:  .  oxyCODONE 30 MG 12 hr tablet, Take by mouth., Disp: , Rfl:  .  oxyCODONE-acetaminophen (PERCOCET/ROXICET) 5-325 MG tablet, Take by mouth., Disp: , Rfl:  .  pregabalin (LYRICA) 100 MG capsule, Take by mouth 2 (two) times daily. , Disp: , Rfl: 1 .  SORAfenib (NEXAVAR) 200 MG tablet, TAKE 2 TABLETS (400MG ) BY MOUTH DAILY. TAKE ON AN EMPTY STOMACH. AVOIDGRAPEFRUIT PRODUCTS., Disp: , Rfl:  .  TRAZODONE HCL PO, Take by mouth at bedtime as needed., Disp: , Rfl:   EXAM:  Vitals:   06/18/18 1443  BP: 100/68  Pulse: 64  Temp: 98.3 F (36.8 C)    Body mass index is 34.28 kg/m.  GENERAL: vitals reviewed and listed above, alert, oriented,  appears well hydrated and in no acute distress  HEENT: atraumatic, conjunttiva clear, no obvious abnormalities on inspection of external nose and ears  NECK: no obvious masses on inspection  LUNGS: clear to auscultation bilaterally, no wheezes, rales or rhonchi, good air movement  CV: HRRR, no peripheral edema  MS: moves all extremities without noticeable abnormality  PSYCH: pleasant and cooperative, no obvious depression or anxiety  ASSESSMENT AND PLAN:  Discussed the following assessment and plan:  Gardner's syndrome  GAD (generalized anxiety disorder)  Jaw pain  Depression, recurrent (HCC)  Desmoid tumor  -Encouraged him to get in for CBT -All pain resolved -Seeing oncology and palliative team for management  of the desmoid tumor in pain related to this -Flu shot today, he plans to discuss with oncology next week -Follow-up 6 months or as needed -Patient advised to return or notify a doctor immediately if symptoms worsen or persist or new concerns arise.  There are no Patient Instructions on file for this visit.  Lucretia Kern, DO

## 2018-06-20 DIAGNOSIS — Z515 Encounter for palliative care: Secondary | ICD-10-CM | POA: Diagnosis not present

## 2018-06-20 DIAGNOSIS — M792 Neuralgia and neuritis, unspecified: Secondary | ICD-10-CM | POA: Diagnosis not present

## 2018-06-20 DIAGNOSIS — R110 Nausea: Secondary | ICD-10-CM | POA: Diagnosis not present

## 2018-06-20 DIAGNOSIS — T451X5A Adverse effect of antineoplastic and immunosuppressive drugs, initial encounter: Secondary | ICD-10-CM | POA: Diagnosis not present

## 2018-06-20 DIAGNOSIS — G893 Neoplasm related pain (acute) (chronic): Secondary | ICD-10-CM | POA: Diagnosis not present

## 2018-06-20 DIAGNOSIS — R112 Nausea with vomiting, unspecified: Secondary | ICD-10-CM | POA: Diagnosis not present

## 2018-06-20 DIAGNOSIS — G4701 Insomnia due to medical condition: Secondary | ICD-10-CM | POA: Diagnosis not present

## 2018-06-20 DIAGNOSIS — R002 Palpitations: Secondary | ICD-10-CM | POA: Diagnosis not present

## 2018-06-28 ENCOUNTER — Encounter: Payer: Self-pay | Admitting: Internal Medicine

## 2018-06-28 ENCOUNTER — Ambulatory Visit (INDEPENDENT_AMBULATORY_CARE_PROVIDER_SITE_OTHER): Payer: BLUE CROSS/BLUE SHIELD | Admitting: Internal Medicine

## 2018-06-28 VITALS — BP 114/76 | HR 66 | Ht 79.0 in | Wt 286.0 lb

## 2018-06-28 DIAGNOSIS — G8929 Other chronic pain: Secondary | ICD-10-CM

## 2018-06-28 DIAGNOSIS — D126 Benign neoplasm of colon, unspecified: Secondary | ICD-10-CM

## 2018-06-28 DIAGNOSIS — R1903 Right lower quadrant abdominal swelling, mass and lump: Secondary | ICD-10-CM | POA: Diagnosis not present

## 2018-06-28 DIAGNOSIS — D132 Benign neoplasm of duodenum: Secondary | ICD-10-CM | POA: Diagnosis not present

## 2018-06-28 DIAGNOSIS — D481 Neoplasm of uncertain behavior of connective and other soft tissue: Secondary | ICD-10-CM

## 2018-06-28 DIAGNOSIS — Q8789 Other specified congenital malformation syndromes, not elsewhere classified: Secondary | ICD-10-CM

## 2018-06-28 DIAGNOSIS — R1031 Right lower quadrant pain: Secondary | ICD-10-CM

## 2018-06-28 MED ORDER — OMEPRAZOLE 40 MG PO CPDR: 40.0000 mg | DELAYED_RELEASE_CAPSULE | Freq: Every day | ORAL | 3 refills | Status: DC

## 2018-06-28 NOTE — Patient Instructions (Signed)
You have been scheduled for an endoscopy. Please follow written instructions given to you at your visit today. If you use inhalers (even only as needed), please bring them with you on the day of your procedure.   

## 2018-06-28 NOTE — Progress Notes (Signed)
HISTORY OF PRESENT ILLNESS:  Jonathan Marsh is a 32 y.o. 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  Also a history of ampullary adenoma status post endoscopic ampullectomy with Dr. Lysle Rubens October 2016.  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  Referred to [facility] for evaluation.  Has been under the care of Dr. Angelina Ok at [facility] and has been on chronic sorafenib therapy 800 mg twice daily.  I last saw the patient for follow-up surveillance upper endoscopy with side-viewing scope June 2017.  No evidence of recurrent duodenal adenoma.  Small duodenal polyp resected was not adenomatous.  He has not had surveillance since due to his issues with his desmoid tumor.  He does have chronic pain syndrome and has narcotics available but uses these rarely.  He does have occasional constipation but typically has 3 or 4 bowel movements per day.  He has been experiencing problems with nausea.  Zofran worked nicely but resulted in migraine headaches.  He presents today wanting to be proactive regarding his surveillance.  He was interested in surveillance upper endoscopy.  He discussed this with his [place] who recommended consultation here with his primary gastroenterologist.  Have reviewed the most recent outside scans, blood work, and office notes from [place].  He is also been followed by palliative care.  He is no longer working.  His stepchildren are in their 54s and his wife continues to work at [employer]:  All non-GI ROS negative except for night sweats, shortness of breath, sleeping problems, anxiety, fatigue  Past Medical History:  Diagnosis Date  . Ampullary adenoma   . Desmoid tumor of abdomen   . Gardner syndrome   . Gastric polyp   . GERD (gastroesophageal reflux disease)   . Headache    mild - stress related  . Multiple gastric polyps   . Polyp of duodenum     Past Surgical History:   Procedure Laterality Date  . APPENDECTOMY    . COLECTOMY     age 76 due to gardners syndrome- has j pouch reconstruction  . COLONOSCOPY    . ESOPHAGOGASTRODUODENOSCOPY N/A 03/30/2015   Procedure: ESOPHAGOGASTRODUODENOSCOPY (EGD);  Surgeon: Irene Shipper, MD;  Location: [facility];  Service: Endoscopy;  Laterality: N/A;  need side viewing scope  . ESOPHAGOGASTRODUODENOSCOPY N/A 01/18/2016   Procedure: ESOPHAGOGASTRODUODENOSCOPY (EGD);  Surgeon: Irene Shipper, MD;  Location: [facility];  Service: Endoscopy;  Laterality: N/A;   use ercp scope   . UPPER GASTROINTESTINAL ENDOSCOPY      Social History [name]  reports that he quit smoking about 10 years ago. His smoking use included cigarettes. He has never used smokeless tobacco. He reports that he does not drink alcohol or use drugs.  family history includes Brain cancer in his paternal grandmother; Cancer in his mother; Colon cancer in his maternal grandfather; Colon polyps in his maternal grandfather and mother; Drug abuse in his mother; Liver cancer in his paternal grandmother; Lung cancer in his maternal grandmother; Other in his maternal grandfather and mother.  Allergies  Allergen Reactions  . Ondansetron Hcl     Other reaction(s): Headache Other reaction(s): Headache,Migraine        PHYSICAL EXAMINATION: Vital signs: BP 114/76   Pulse 66   Ht 6\' 7"  (2.007 m)   Wt 286 lb (129.7 kg)   BMI 32.22 kg/m   Constitutional: generally well-appearing, no acute distress Psychiatric: alert and oriented x3,  cooperative Eyes: extraocular movements intact, anicteric, conjunctiva pink Mouth: oral pharynx moist, no lesions Neck: supple no lymphadenopathy Cardiovascular: heart regular rate and rhythm, no murmur Lungs: clear to auscultation bilaterally Abdomen: Tender obvious large right lower quadrant mass, nondistended, no obvious ascites, no peritoneal signs, normal bowel sounds, no organomegaly Rectal: Omitted Extremities: no  clubbing, cyanosis, or lower extremity edema bilaterally Skin: no lesions on visible extremities Neuro: No focal deficits.  Cranial nerves intact  ASSESSMENT:  1.  Gardner syndrome 2.  Status post total abdominal colectomy 3.  History of duodenal adenoma status post ampullectomy 4.  Large right lower quadrant desmoid tumor with resultant pain being managed at [facility].  On sorafenib   PLAN:  1.  Set up surveillance upper endoscopy with side-viewing scope at the hospital.The nature of the procedure, as well as the risks, benefits, and alternatives were carefully and thoroughly reviewed with the patient. Ample time for discussion and questions allowed. The patient understood, was satisfied, and agreed to proceed. 2.  Ongoing management of his desmoid tumor per [facility]

## 2018-06-28 NOTE — H&P (View-Only) (Signed)
HISTORY OF PRESENT ILLNESS:  Jonathan Marsh is a 32 y.o. 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  Also a history of ampullary adenoma status post endoscopic ampullectomy with Dr. Lysle Rubens October 2016.  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  Referred to [facility] for evaluation.  Has been under the care of Dr. Angelina Ok at [facility] and has been on chronic sorafenib therapy 800 mg twice daily.  I last saw the patient for follow-up surveillance upper endoscopy with side-viewing scope June 2017.  No evidence of recurrent duodenal adenoma.  Small duodenal polyp resected was not adenomatous.  He has not had surveillance since due to his issues with his desmoid tumor.  He does have chronic pain syndrome and has narcotics available but uses these rarely.  He does have occasional constipation but typically has 3 or 4 bowel movements per day.  He has been experiencing problems with nausea.  Zofran worked nicely but resulted in migraine headaches.  He presents today wanting to be proactive regarding his surveillance.  He was interested in surveillance upper endoscopy.  He discussed this with his [place] who recommended consultation here with his primary gastroenterologist.  Have reviewed the most recent outside scans, blood work, and office notes from [place].  He is also been followed by palliative care.  He is no longer working.  His stepchildren are in their 36s and his wife continues to work at [employer]:  All non-GI ROS negative except for night sweats, shortness of breath, sleeping problems, anxiety, fatigue  Past Medical History:  Diagnosis Date  . Ampullary adenoma   . Desmoid tumor of abdomen   . Gardner syndrome   . Gastric polyp   . GERD (gastroesophageal reflux disease)   . Headache    mild - stress related  . Multiple gastric polyps   . Polyp of duodenum     Past Surgical History:   Procedure Laterality Date  . APPENDECTOMY    . COLECTOMY     age 33 due to gardners syndrome- has j pouch reconstruction  . COLONOSCOPY    . ESOPHAGOGASTRODUODENOSCOPY N/A 03/30/2015   Procedure: ESOPHAGOGASTRODUODENOSCOPY (EGD);  Surgeon: Irene Shipper, MD;  Location: [facility];  Service: Endoscopy;  Laterality: N/A;  need side viewing scope  . ESOPHAGOGASTRODUODENOSCOPY N/A 01/18/2016   Procedure: ESOPHAGOGASTRODUODENOSCOPY (EGD);  Surgeon: Irene Shipper, MD;  Location: [facility];  Service: Endoscopy;  Laterality: N/A;   use ercp scope   . UPPER GASTROINTESTINAL ENDOSCOPY      Social History Petr Bontempo  reports that he quit smoking about 10 years ago. His smoking use included cigarettes. He has never used smokeless tobacco. He reports that he does not drink alcohol or use drugs.  family history includes Brain cancer in his paternal grandmother; Cancer in his mother; Colon cancer in his maternal grandfather; Colon polyps in his maternal grandfather and mother; Drug abuse in his mother; Liver cancer in his paternal grandmother; Lung cancer in his maternal grandmother; Other in his maternal grandfather and mother.  Allergies  Allergen Reactions  . Ondansetron Hcl     Other reaction(s): Headache Other reaction(s): Headache,Migraine        PHYSICAL EXAMINATION: Vital signs: BP 114/76   Pulse 66   Ht 6\' 7"  (2.007 m)   Wt 286 lb (129.7 kg)   BMI 32.22 kg/m   Constitutional: generally well-appearing, no acute distress Psychiatric: alert and oriented x3,  cooperative Eyes: extraocular movements intact, anicteric, conjunctiva pink Mouth: oral pharynx moist, no lesions Neck: supple no lymphadenopathy Cardiovascular: heart regular rate and rhythm, no murmur Lungs: clear to auscultation bilaterally Abdomen: Tender obvious large right lower quadrant mass, nondistended, no obvious ascites, no peritoneal signs, normal bowel sounds, no organomegaly Rectal: Omitted Extremities: no  clubbing, cyanosis, or lower extremity edema bilaterally Skin: no lesions on visible extremities Neuro: No focal deficits.  Cranial nerves intact  ASSESSMENT:  1.  Gardner syndrome 2.  Status post total abdominal colectomy 3.  History of duodenal adenoma status post ampullectomy 4.  Large right lower quadrant desmoid tumor with resultant pain being managed at [facility].  On sorafenib   PLAN:  1.  Set up surveillance upper endoscopy with side-viewing scope at the hospital.The nature of the procedure, as well as the risks, benefits, and alternatives were carefully and thoroughly reviewed with the patient. Ample time for discussion and questions allowed. The patient understood, was satisfied, and agreed to proceed. 2.  Ongoing management of his desmoid tumor per [facility]

## 2018-07-18 ENCOUNTER — Encounter (HOSPITAL_COMMUNITY): Payer: Self-pay | Admitting: *Deleted

## 2018-07-19 ENCOUNTER — Ambulatory Visit (HOSPITAL_COMMUNITY): Payer: BLUE CROSS/BLUE SHIELD | Admitting: Certified Registered Nurse Anesthetist

## 2018-07-19 ENCOUNTER — Other Ambulatory Visit: Payer: Self-pay

## 2018-07-19 ENCOUNTER — Encounter (HOSPITAL_COMMUNITY): Payer: Self-pay | Admitting: Internal Medicine

## 2018-07-19 ENCOUNTER — Encounter (HOSPITAL_COMMUNITY)
Admission: RE | Disposition: A | Discharge status: Home / Self Care | Payer: Self-pay | Source: Ambulatory Visit | Attending: Internal Medicine

## 2018-07-19 ENCOUNTER — Ambulatory Visit (HOSPITAL_COMMUNITY)
Admission: RE | Admit: 2018-07-19 | Discharge: 2018-07-19 | Disposition: A | Discharge status: Home / Self Care | Payer: BLUE CROSS/BLUE SHIELD | Source: Ambulatory Visit | Attending: Internal Medicine | Admitting: Internal Medicine

## 2018-07-19 DIAGNOSIS — F411 Generalized anxiety disorder: Secondary | ICD-10-CM | POA: Diagnosis not present

## 2018-07-19 DIAGNOSIS — Z9049 Acquired absence of other specified parts of digestive tract: Secondary | ICD-10-CM | POA: Insufficient documentation

## 2018-07-19 DIAGNOSIS — K219 Gastro-esophageal reflux disease without esophagitis: Secondary | ICD-10-CM

## 2018-07-19 DIAGNOSIS — K317 Polyp of stomach and duodenum: Secondary | ICD-10-CM

## 2018-07-19 DIAGNOSIS — Z800 Family history of malignant neoplasm of digestive organs: Secondary | ICD-10-CM | POA: Insufficient documentation

## 2018-07-19 DIAGNOSIS — Z79899 Other long term (current) drug therapy: Secondary | ICD-10-CM | POA: Diagnosis not present

## 2018-07-19 DIAGNOSIS — D481 Neoplasm of uncertain behavior of connective and other soft tissue: Secondary | ICD-10-CM | POA: Insufficient documentation

## 2018-07-19 DIAGNOSIS — Z79891 Long term (current) use of opiate analgesic: Secondary | ICD-10-CM | POA: Insufficient documentation

## 2018-07-19 DIAGNOSIS — Z8719 Personal history of other diseases of the digestive system: Secondary | ICD-10-CM | POA: Diagnosis not present

## 2018-07-19 DIAGNOSIS — G894 Chronic pain syndrome: Secondary | ICD-10-CM | POA: Diagnosis not present

## 2018-07-19 DIAGNOSIS — Z888 Allergy status to other drugs, medicaments and biological substances status: Secondary | ICD-10-CM | POA: Diagnosis not present

## 2018-07-19 DIAGNOSIS — Z87891 Personal history of nicotine dependence: Secondary | ICD-10-CM | POA: Diagnosis not present

## 2018-07-19 HISTORY — PX: ESOPHAGOGASTRODUODENOSCOPY (EGD) WITH PROPOFOL: SHX5813

## 2018-07-19 SURGERY — ESOPHAGOGASTRODUODENOSCOPY (EGD) WITH PROPOFOL
Anesthesia: Monitor Anesthesia Care

## 2018-07-19 MED ORDER — PROPOFOL 500 MG/50ML IV EMUL
INTRAVENOUS | Status: DC | PRN
  Administered 2018-07-19: 150 ug/kg/min via INTRAVENOUS

## 2018-07-19 MED ORDER — LACTATED RINGERS IV SOLN
INTRAVENOUS | Status: DC
  Administered 2018-07-19: 1000 mL via INTRAVENOUS

## 2018-07-19 MED ORDER — PROPOFOL 10 MG/ML IV BOLUS
INTRAVENOUS | Status: AC
  Filled 2018-07-19: qty 60

## 2018-07-19 MED ORDER — SODIUM CHLORIDE 0.9 % IV SOLN: INTRAVENOUS | Status: DC

## 2018-07-19 MED ORDER — PROPOFOL 10 MG/ML IV BOLUS
INTRAVENOUS | Status: DC | PRN
  Administered 2018-07-19 (×2): 30 mg via INTRAVENOUS

## 2018-07-19 SURGICAL SUPPLY — 14 items

## 2018-07-19 NOTE — Op Note (Signed)
[facility] Patient Name: Jonathan Marsh Procedure Date: 07/19/2018 MRN: [id number] Attending MD: Docia Chuck. Henrene Pastor , MD Date of Birth: [birth year] CSN: [id number] Age: 32 Admit Type: Outpatient Procedure:                Upper GI endoscopy with side-viewing scope Indications:              Surveillance procedure Providers:                Docia Chuck. Henrene Pastor, MD, Burtis Junes, RN, Tinnie Gens,                            Technician, Adair Laundry, CRNA Referring MD:              Medicines:                Monitored Anesthesia Care Complications:            No immediate complications. Estimated Blood Loss:     Estimated blood loss: none. Procedure:                Pre-Anesthesia Assessment:                           - Prior to the procedure, a History and Physical                            was performed, and patient medications and                            allergies were reviewed. The patient's tolerance of                            previous anesthesia was also reviewed. The risks                            and benefits of the procedure and the sedation                            options and risks were discussed with the patient.                            All questions were answered, and informed consent                            was obtained. Prior Anticoagulants: The patient has                            taken no previous anticoagulant or antiplatelet                            agents. ASA Grade Assessment: III - A patient with                            severe systemic disease. After reviewing the risks  and benefits, the patient was deemed in                            satisfactory condition to undergo the procedure.                           After obtaining informed consent, the endoscope was                            passed under direct vision. Throughout the                            procedure, the patient's blood pressure, pulse, and              oxygen saturations were monitored continuously. The                            [id number]-[id number] ([id number]) Olympus ERCP was introduced                            through the mouth, and advanced to the third part                            of duodenum. The upper GI endoscopy was                            accomplished without difficulty. The patient                            tolerated the procedure well. Scope In: Scope Out: Findings:      The esophagus was normal normal in the region of the gastroesophageal       junction. Other portions not examined secondary to the use of a       side-viewing scope.      Multiple small pedunculated polyps were found in the gastric body and       fundus.      The examined duodenum was normal. The area of previous ampullectomy was       unremarkable.      The cardia and gastric fundus were normal on retroflexion. Impression:               1. Multiple small gastric polyps in the proximal                            stomach unchanged from previous exam                           2. Unremarkable duodenum particularly in the region                            of the ampulla. Moderate Sedation:      none Recommendation:           - Patient has a contact number available for  emergencies. The signs and symptoms of potential                            delayed complications were discussed with the                            patient. Return to normal activities tomorrow.                            Written discharge instructions were provided to the                            patient.                           - Resume previous diet.                           - Continue present medications.                           - Repeat EGD with ERCP scope in 1 year Procedure Code(s):        --- Professional ---                           [phone], Esophagogastroduodenoscopy, flexible,                            transoral; diagnostic, including  collection of                            specimen(s) by brushing or washing, when performed                            (separate procedure) Diagnosis Code(s):        --- Professional ---                           K31.7, Polyp of stomach and duodenum CPT copyright 2018 American Medical Association. All rights reserved. The codes documented in this report are preliminary and upon coder review may  be revised to meet current compliance requirements. Docia Chuck. Henrene Pastor, MD 07/19/2018 10:49:49 AM This report has been signed electronically. Number of Addenda: 0

## 2018-07-19 NOTE — Addendum Note (Signed)
Addendum  created 07/19/18 1118 by Montel Clock, CRNA   Intraprocedure Flowsheets edited

## 2018-07-19 NOTE — Discharge Instructions (Signed)
YOU HAD AN ENDOSCOPIC PROCEDURE TODAY: Refer to the procedure report and other information in the discharge instructions given to you for any specific questions about what was found during the examination. If this information does not answer your questions, please call [place] office at [phone] to clarify.  ° °YOU SHOULD EXPECT: Some feelings of bloating in the abdomen. Passage of more gas than usual. Walking can help get rid of the air that was put into your GI tract during the procedure and reduce the bloating. If you had a lower endoscopy (such as a colonoscopy or flexible sigmoidoscopy) you may notice spotting of blood in your stool or on the toilet paper. Some abdominal soreness may be present for a day or two, also. ° °DIET: Your first meal following the procedure should be a light meal and then it is ok to progress to your normal diet. A half-sandwich or bowl of soup is an example of a good first meal. Heavy or fried foods are harder to digest and may make you feel nauseous or bloated. Drink plenty of fluids but you should avoid alcoholic beverages for 24 hours. If you had a esophageal dilation, please see attached instructions for diet.   ° °ACTIVITY: Your care partner should take you home directly after the procedure. You should plan to take it easy, moving slowly for the rest of the day. You can resume normal activity the day after the procedure however YOU SHOULD NOT DRIVE, use power tools, machinery or perform tasks that involve climbing or major physical exertion for 24 hours (because of the sedation medicines used during the test).  ° °SYMPTOMS TO REPORT IMMEDIATELY: °A gastroenterologist can be reached at any hour. Please call [phone]  for any of the following symptoms:  °Following lower endoscopy (colonoscopy, flexible sigmoidoscopy) °Excessive amounts of blood in the stool  °Significant tenderness, worsening of abdominal pains  °Swelling of the abdomen that is new, acute  °Fever of 100° or  higher  °Following upper endoscopy (EGD, EUS, ERCP, esophageal dilation) °Vomiting of blood or coffee ground material  °New, significant abdominal pain  °New, significant chest pain or pain under the shoulder blades  °Painful or persistently difficult swallowing  °New shortness of breath  °Black, tarry-looking or red, bloody stools ° °FOLLOW UP:  °If any biopsies were taken you will be contacted by phone or by letter within the next 1-3 weeks. Call [phone]  if you have not heard about the biopsies in 3 weeks.  °Please also call with any specific questions about appointments or follow up tests. ° °

## 2018-07-19 NOTE — Transfer of Care (Signed)
Immediate Anesthesia Transfer of Care Note  Patient: Jonathan Marsh  Procedure(s) Performed: ESOPHAGOGASTRODUODENOSCOPY (EGD) WITH PROPOFOL (N/A )  Patient Location: PACU and Endoscopy Unit  Anesthesia Type:MAC  Level of Consciousness: drowsy and responds to stimulation  Airway & Oxygen Therapy: Patient Spontanous Breathing and Patient connected to nasal cannula oxygen  Post-op Assessment: Report given to RN and Post -op Vital signs reviewed and stable  Post vital signs: Reviewed and stable  Last Vitals:  Vitals Value Taken Time  BP 82/42 07/19/2018 10:46 AM  Temp 36.4 C 07/19/2018 10:46 AM  Pulse 48 07/19/2018 10:48 AM  Resp 13 07/19/2018 10:48 AM  SpO2 98 % 07/19/2018 10:48 AM  Vitals shown include unvalidated device data.  Last Pain:  Vitals:   07/19/18 1046  TempSrc: Oral  PainSc:          Complications: No apparent anesthesia complications

## 2018-07-19 NOTE — Anesthesia Postprocedure Evaluation (Signed)
Anesthesia Post Note  Patient: Jammal Sarr  Procedure(s) Performed: ESOPHAGOGASTRODUODENOSCOPY (EGD) WITH PROPOFOL (N/A )     Patient location during evaluation: PACU Anesthesia Type: MAC Level of consciousness: awake and alert Pain management: pain level controlled Vital Signs Assessment: post-procedure vital signs reviewed and stable Respiratory status: spontaneous breathing, nonlabored ventilation and respiratory function stable Cardiovascular status: blood pressure returned to baseline and stable Postop Assessment: no apparent nausea or vomiting Anesthetic complications: no    Last Vitals:  Vitals:   07/19/18 1050 07/19/18 1100  BP: (!) 86/37 (!) 95/59  Pulse: (!) 48 (!) 44  Resp: 13 13  Temp:    SpO2: 97% 100%    Last Pain:  Vitals:   07/19/18 1050  TempSrc:   PainSc: 0-No pain                 Brennan Bailey

## 2018-07-19 NOTE — Anesthesia Preprocedure Evaluation (Signed)
Anesthesia Evaluation  Patient identified by MRN, date of birth, ID band Patient awake    Reviewed: Allergy & Precautions, NPO status , Patient's Chart, lab work & pertinent test results  History of Anesthesia Complications Negative for: history of anesthetic complications  Airway Mallampati: II  TM Distance: >3 FB Neck ROM: Full    Dental  (+) Teeth Intact, Dental Advisory Given   Pulmonary former smoker,    Pulmonary exam normal breath sounds clear to auscultation       Cardiovascular negative cardio ROS Normal cardiovascular exam Rhythm:Regular Rate:Normal     Neuro/Psych Anxiety negative neurological ROS     GI/Hepatic Neg liver ROS, GERD  Controlled and Medicated,Duodenal adenoma   Endo/Other  negative endocrine ROS  Renal/GU negative Renal ROS     Musculoskeletal negative musculoskeletal ROS (+) Chronic pain on opioids   Abdominal   Peds  Hematology negative hematology ROS (+)   Anesthesia Other Findings Day of surgery medications reviewed with the patient.  Reproductive/Obstetrics                             Anesthesia Physical Anesthesia Plan  ASA: II  Anesthesia Plan: MAC   Post-op Pain Management:    Induction:   PONV Risk Score and Plan: Treatment may vary due to age or medical condition and Propofol infusion  Airway Management Planned: Natural Airway and Nasal Cannula  Additional Equipment:   Intra-op Plan:   Post-operative Plan:   Informed Consent: I have reviewed the patients History and Physical, chart, labs and discussed the procedure including the risks, benefits and alternatives for the proposed anesthesia with the patient or authorized representative who has indicated his/her understanding and acceptance.   Dental advisory given  Plan Discussed with: CRNA  Anesthesia Plan Comments:         Anesthesia Quick Evaluation

## 2018-07-19 NOTE — Interval H&P Note (Signed)
History and Physical Interval Note:  07/19/2018 9:20 AM  Jonathan Marsh  has presented today for surgery, with the diagnosis of duodenal adenoma;   EGD WITH ERCP SCOPE  The various methods of treatment have been discussed with the patient and family. After consideration of risks, benefits and other options for treatment, the patient has consented to  Procedure(s) with comments: ESOPHAGOGASTRODUODENOSCOPY (EGD) WITH PROPOFOL (N/A) - ERCP SCOPE as a surgical intervention .  The patient's history has been reviewed, patient examined, no change in status, stable for surgery.  I have reviewed the patient's chart and labs.  Questions were answered to the patient's satisfaction.     Jonathan Marsh

## 2018-07-22 ENCOUNTER — Encounter (HOSPITAL_COMMUNITY): Payer: Self-pay | Admitting: Internal Medicine

## 2018-08-22 DIAGNOSIS — M792 Neuralgia and neuritis, unspecified: Secondary | ICD-10-CM | POA: Diagnosis not present

## 2018-08-22 DIAGNOSIS — R112 Nausea with vomiting, unspecified: Secondary | ICD-10-CM | POA: Diagnosis not present

## 2018-08-22 DIAGNOSIS — D481 Neoplasm of uncertain behavior of connective and other soft tissue: Secondary | ICD-10-CM | POA: Diagnosis not present

## 2018-08-22 DIAGNOSIS — R110 Nausea: Secondary | ICD-10-CM | POA: Diagnosis not present

## 2018-08-22 DIAGNOSIS — R197 Diarrhea, unspecified: Secondary | ICD-10-CM | POA: Diagnosis not present

## 2018-08-22 DIAGNOSIS — Z515 Encounter for palliative care: Secondary | ICD-10-CM | POA: Diagnosis not present

## 2018-08-22 DIAGNOSIS — R630 Anorexia: Secondary | ICD-10-CM | POA: Diagnosis not present

## 2018-08-22 DIAGNOSIS — T451X5A Adverse effect of antineoplastic and immunosuppressive drugs, initial encounter: Secondary | ICD-10-CM | POA: Diagnosis not present

## 2018-08-22 DIAGNOSIS — G893 Neoplasm related pain (acute) (chronic): Secondary | ICD-10-CM | POA: Diagnosis not present

## 2018-08-28 DIAGNOSIS — Z9049 Acquired absence of other specified parts of digestive tract: Secondary | ICD-10-CM | POA: Diagnosis not present

## 2018-08-28 DIAGNOSIS — R1010 Upper abdominal pain, unspecified: Secondary | ICD-10-CM | POA: Diagnosis not present

## 2018-08-28 DIAGNOSIS — R5383 Other fatigue: Secondary | ICD-10-CM | POA: Diagnosis not present

## 2018-08-28 DIAGNOSIS — Z801 Family history of malignant neoplasm of trachea, bronchus and lung: Secondary | ICD-10-CM | POA: Diagnosis not present

## 2018-08-28 DIAGNOSIS — Z808 Family history of malignant neoplasm of other organs or systems: Secondary | ICD-10-CM | POA: Diagnosis not present

## 2018-08-28 DIAGNOSIS — Z8371 Family history of colonic polyps: Secondary | ICD-10-CM | POA: Diagnosis not present

## 2018-08-28 DIAGNOSIS — Z800 Family history of malignant neoplasm of digestive organs: Secondary | ICD-10-CM | POA: Diagnosis not present

## 2018-08-28 DIAGNOSIS — R197 Diarrhea, unspecified: Secondary | ICD-10-CM | POA: Diagnosis not present

## 2018-08-28 DIAGNOSIS — D481 Neoplasm of uncertain behavior of connective and other soft tissue: Secondary | ICD-10-CM | POA: Diagnosis not present

## 2018-08-28 DIAGNOSIS — R112 Nausea with vomiting, unspecified: Secondary | ICD-10-CM | POA: Diagnosis not present

## 2018-09-05 DIAGNOSIS — D481 Neoplasm of uncertain behavior of connective and other soft tissue: Secondary | ICD-10-CM | POA: Diagnosis not present

## 2018-09-06 DIAGNOSIS — D481 Neoplasm of uncertain behavior of connective and other soft tissue: Secondary | ICD-10-CM | POA: Diagnosis not present

## 2018-09-06 DIAGNOSIS — I517 Cardiomegaly: Secondary | ICD-10-CM | POA: Diagnosis not present

## 2018-09-11 DIAGNOSIS — D481 Neoplasm of uncertain behavior of connective and other soft tissue: Secondary | ICD-10-CM | POA: Diagnosis not present

## 2018-09-11 DIAGNOSIS — Z5111 Encounter for antineoplastic chemotherapy: Secondary | ICD-10-CM | POA: Diagnosis not present

## 2018-09-11 DIAGNOSIS — Z79899 Other long term (current) drug therapy: Secondary | ICD-10-CM | POA: Diagnosis not present

## 2018-09-12 DIAGNOSIS — D481 Neoplasm of uncertain behavior of connective and other soft tissue: Secondary | ICD-10-CM | POA: Diagnosis not present

## 2018-10-03 DIAGNOSIS — Z5111 Encounter for antineoplastic chemotherapy: Secondary | ICD-10-CM | POA: Diagnosis not present

## 2018-10-03 DIAGNOSIS — D481 Neoplasm of uncertain behavior of connective and other soft tissue: Secondary | ICD-10-CM | POA: Diagnosis not present

## 2018-10-04 DIAGNOSIS — D481 Neoplasm of uncertain behavior of connective and other soft tissue: Secondary | ICD-10-CM | POA: Diagnosis not present

## 2018-10-09 DIAGNOSIS — R531 Weakness: Secondary | ICD-10-CM | POA: Diagnosis not present

## 2018-10-09 DIAGNOSIS — F17200 Nicotine dependence, unspecified, uncomplicated: Secondary | ICD-10-CM | POA: Diagnosis not present

## 2018-10-09 DIAGNOSIS — Z888 Allergy status to other drugs, medicaments and biological substances status: Secondary | ICD-10-CM | POA: Diagnosis not present

## 2018-10-09 DIAGNOSIS — Z79899 Other long term (current) drug therapy: Secondary | ICD-10-CM | POA: Diagnosis not present

## 2018-10-09 DIAGNOSIS — R7989 Other specified abnormal findings of blood chemistry: Secondary | ICD-10-CM | POA: Diagnosis not present

## 2018-10-09 DIAGNOSIS — R509 Fever, unspecified: Secondary | ICD-10-CM | POA: Diagnosis not present

## 2018-10-24 DIAGNOSIS — D481 Neoplasm of uncertain behavior of connective and other soft tissue: Secondary | ICD-10-CM | POA: Diagnosis not present

## 2018-10-24 DIAGNOSIS — E860 Dehydration: Secondary | ICD-10-CM | POA: Diagnosis not present

## 2018-10-24 DIAGNOSIS — I959 Hypotension, unspecified: Secondary | ICD-10-CM | POA: Diagnosis not present

## 2018-10-24 DIAGNOSIS — R638 Other symptoms and signs concerning food and fluid intake: Secondary | ICD-10-CM | POA: Diagnosis not present

## 2018-10-24 DIAGNOSIS — Z5111 Encounter for antineoplastic chemotherapy: Secondary | ICD-10-CM | POA: Diagnosis not present

## 2018-10-25 DIAGNOSIS — D481 Neoplasm of uncertain behavior of connective and other soft tissue: Secondary | ICD-10-CM | POA: Diagnosis not present

## 2018-11-19 ENCOUNTER — Ambulatory Visit (INDEPENDENT_AMBULATORY_CARE_PROVIDER_SITE_OTHER): Payer: Self-pay | Admitting: Family Medicine

## 2018-11-19 ENCOUNTER — Telehealth: Payer: Self-pay | Admitting: *Deleted

## 2018-11-19 ENCOUNTER — Other Ambulatory Visit: Payer: Self-pay

## 2018-11-19 DIAGNOSIS — Z719 Counseling, unspecified: Secondary | ICD-10-CM

## 2018-11-19 NOTE — Telephone Encounter (Signed)
I left a detailed message for the pt to call the office and schedule a transfer of care visit with the physician of his choice.

## 2018-11-19 NOTE — Progress Notes (Signed)
Called pt as had visit today, but he was not aware. He reports does not need anything at this time as everything is managed by specialist. However, does need to get set up with a new PCP and requests my opinion for this. On 3/4 chemo treatments at [facility] for enlarging Desmoid tumor. Has visit this week with his oncology term and then imaging to see where the tumor is. He is seeing palliative care for pain management. Has had a lot of bone pain with the chemo. Will send my assistant a message to get him and his wife set up with a TOC visit with Dr. Jerilee Hoh or Dr. Ethlyn Gallery.

## 2018-11-19 NOTE — Telephone Encounter (Signed)
-----   Message from Lucretia Kern, DO sent at 11/19/2018  1:16 PM EDT ----- Can you set Pilar Plate and his wife up with [facility] visits with Dr. Ethlyn Gallery (if available), Dr. Volanda Napoleon or Dr. Jerilee Hoh in the next 2-3 months? Thanks.

## 2018-11-21 DIAGNOSIS — R109 Unspecified abdominal pain: Secondary | ICD-10-CM | POA: Diagnosis not present

## 2018-11-21 DIAGNOSIS — Z9049 Acquired absence of other specified parts of digestive tract: Secondary | ICD-10-CM | POA: Diagnosis not present

## 2018-11-21 DIAGNOSIS — G629 Polyneuropathy, unspecified: Secondary | ICD-10-CM | POA: Diagnosis not present

## 2018-11-21 DIAGNOSIS — G893 Neoplasm related pain (acute) (chronic): Secondary | ICD-10-CM | POA: Diagnosis not present

## 2018-11-21 DIAGNOSIS — Z79899 Other long term (current) drug therapy: Secondary | ICD-10-CM | POA: Diagnosis not present

## 2018-11-21 DIAGNOSIS — R55 Syncope and collapse: Secondary | ICD-10-CM | POA: Diagnosis not present

## 2018-11-21 DIAGNOSIS — M792 Neuralgia and neuritis, unspecified: Secondary | ICD-10-CM | POA: Diagnosis not present

## 2018-11-21 DIAGNOSIS — R1900 Intra-abdominal and pelvic swelling, mass and lump, unspecified site: Secondary | ICD-10-CM | POA: Diagnosis not present

## 2018-11-21 DIAGNOSIS — D481 Neoplasm of uncertain behavior of connective and other soft tissue: Secondary | ICD-10-CM | POA: Diagnosis not present

## 2018-11-21 DIAGNOSIS — Z515 Encounter for palliative care: Secondary | ICD-10-CM | POA: Diagnosis not present

## 2018-11-28 DIAGNOSIS — R55 Syncope and collapse: Secondary | ICD-10-CM | POA: Diagnosis not present

## 2018-11-28 DIAGNOSIS — D481 Neoplasm of uncertain behavior of connective and other soft tissue: Secondary | ICD-10-CM | POA: Diagnosis not present

## 2018-11-28 DIAGNOSIS — Z006 Encounter for examination for normal comparison and control in clinical research program: Secondary | ICD-10-CM | POA: Diagnosis not present

## 2018-12-04 DIAGNOSIS — D481 Neoplasm of uncertain behavior of connective and other soft tissue: Secondary | ICD-10-CM | POA: Diagnosis not present

## 2018-12-10 DIAGNOSIS — D481 Neoplasm of uncertain behavior of connective and other soft tissue: Secondary | ICD-10-CM | POA: Diagnosis not present

## 2018-12-10 DIAGNOSIS — R1909 Other intra-abdominal and pelvic swelling, mass and lump: Secondary | ICD-10-CM | POA: Diagnosis not present

## 2018-12-12 DIAGNOSIS — Z9049 Acquired absence of other specified parts of digestive tract: Secondary | ICD-10-CM | POA: Diagnosis not present

## 2018-12-12 DIAGNOSIS — G893 Neoplasm related pain (acute) (chronic): Secondary | ICD-10-CM | POA: Diagnosis not present

## 2018-12-12 DIAGNOSIS — Z79899 Other long term (current) drug therapy: Secondary | ICD-10-CM | POA: Diagnosis not present

## 2018-12-12 DIAGNOSIS — Z006 Encounter for examination for normal comparison and control in clinical research program: Secondary | ICD-10-CM | POA: Diagnosis not present

## 2018-12-12 DIAGNOSIS — R42 Dizziness and giddiness: Secondary | ICD-10-CM | POA: Diagnosis not present

## 2018-12-12 DIAGNOSIS — R112 Nausea with vomiting, unspecified: Secondary | ICD-10-CM | POA: Diagnosis not present

## 2018-12-12 DIAGNOSIS — D481 Neoplasm of uncertain behavior of connective and other soft tissue: Secondary | ICD-10-CM | POA: Diagnosis not present

## 2018-12-12 DIAGNOSIS — I951 Orthostatic hypotension: Secondary | ICD-10-CM | POA: Diagnosis not present

## 2018-12-13 DIAGNOSIS — G894 Chronic pain syndrome: Secondary | ICD-10-CM | POA: Diagnosis not present

## 2018-12-13 DIAGNOSIS — R109 Unspecified abdominal pain: Secondary | ICD-10-CM | POA: Diagnosis not present

## 2018-12-13 DIAGNOSIS — G893 Neoplasm related pain (acute) (chronic): Secondary | ICD-10-CM | POA: Diagnosis not present

## 2018-12-13 DIAGNOSIS — F1190 Opioid use, unspecified, uncomplicated: Secondary | ICD-10-CM | POA: Diagnosis not present

## 2018-12-16 DIAGNOSIS — Z01812 Encounter for preprocedural laboratory examination: Secondary | ICD-10-CM | POA: Diagnosis not present

## 2018-12-16 DIAGNOSIS — Z1159 Encounter for screening for other viral diseases: Secondary | ICD-10-CM | POA: Diagnosis not present

## 2018-12-16 DIAGNOSIS — C259 Malignant neoplasm of pancreas, unspecified: Secondary | ICD-10-CM | POA: Diagnosis not present

## 2018-12-17 DIAGNOSIS — G893 Neoplasm related pain (acute) (chronic): Secondary | ICD-10-CM | POA: Diagnosis not present

## 2018-12-17 DIAGNOSIS — Z9221 Personal history of antineoplastic chemotherapy: Secondary | ICD-10-CM | POA: Diagnosis not present

## 2018-12-17 DIAGNOSIS — C259 Malignant neoplasm of pancreas, unspecified: Secondary | ICD-10-CM | POA: Diagnosis not present

## 2018-12-17 DIAGNOSIS — Z9049 Acquired absence of other specified parts of digestive tract: Secondary | ICD-10-CM | POA: Diagnosis not present

## 2018-12-17 DIAGNOSIS — Z79899 Other long term (current) drug therapy: Secondary | ICD-10-CM | POA: Diagnosis not present

## 2018-12-19 DIAGNOSIS — Z006 Encounter for examination for normal comparison and control in clinical research program: Secondary | ICD-10-CM | POA: Diagnosis not present

## 2018-12-19 DIAGNOSIS — D481 Neoplasm of uncertain behavior of connective and other soft tissue: Secondary | ICD-10-CM | POA: Diagnosis not present

## 2018-12-19 DIAGNOSIS — G893 Neoplasm related pain (acute) (chronic): Secondary | ICD-10-CM | POA: Diagnosis not present

## 2018-12-23 ENCOUNTER — Ambulatory Visit (INDEPENDENT_AMBULATORY_CARE_PROVIDER_SITE_OTHER): Payer: BLUE CROSS/BLUE SHIELD | Admitting: Family Medicine

## 2018-12-23 ENCOUNTER — Other Ambulatory Visit: Payer: Self-pay

## 2018-12-23 ENCOUNTER — Encounter: Payer: Self-pay | Admitting: Family Medicine

## 2018-12-23 DIAGNOSIS — D126 Benign neoplasm of colon, unspecified: Secondary | ICD-10-CM

## 2018-12-23 DIAGNOSIS — F411 Generalized anxiety disorder: Secondary | ICD-10-CM

## 2018-12-23 DIAGNOSIS — Q8789 Other specified congenital malformation syndromes, not elsewhere classified: Secondary | ICD-10-CM

## 2018-12-23 DIAGNOSIS — D481 Neoplasm of uncertain behavior of connective and other soft tissue: Secondary | ICD-10-CM | POA: Insufficient documentation

## 2018-12-23 NOTE — Progress Notes (Signed)
Virtual Visit via Video Note  I connected with Jonathan Marsh  on 12/23/18 at 11:30 AM EDT by a video enabled telemedicine application and verified that I am speaking with the correct person using two identifiers.  Location patient: home Location provider:work office Persons participating in the virtual visit: patient, provider  I discussed the limitations of evaluation and management by telemedicine and the availability of in person appointments. The patient expressed understanding and agreed to proceed.   Jonathan Marsh DOB: January 19, 1986 Encounter date: 12/23/2018  This is a 33 y.o. male who presents to establish care. Chief Complaint  Patient presents with  . Establish Care    History of present illness: No specific concerns today.   He is seeing behavioral therapist Glory Buff (?sp) through this transition with tumor. Good support system between wife and kids. Had procedure done last week where they tried to inject some nerves to help with pain - this did seem to improve pain from 8.5-9 to 5-6/10. He was pleased with this improvement.   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 In 3rd stage of clinical trial.   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  See above. Desmoid tumor: being seen at [facility], palliative care for pain mgmt as well.    Past Medical History:  Diagnosis Date  . Ampullary adenoma   . Desmoid tumor of abdomen   . Gardner syndrome   . Gastric polyp   . GERD (gastroesophageal reflux disease)   . Headache    mild - stress related  . Multiple gastric polyps   . Polyp of duodenum    Past Surgical History:  Procedure Laterality Date  . APPENDECTOMY    . COLECTOMY     age 53 due to gardners syndrome- has j pouch reconstruction  . COLONOSCOPY    . ESOPHAGOGASTRODUODENOSCOPY N/A 03/30/2015   Procedure: ESOPHAGOGASTRODUODENOSCOPY (EGD);  Surgeon: Irene Shipper, MD;  Location: [facility];   Service: Endoscopy;  Laterality: N/A;  need side viewing scope  . ESOPHAGOGASTRODUODENOSCOPY N/A 01/18/2016   Procedure: ESOPHAGOGASTRODUODENOSCOPY (EGD);  Surgeon: Irene Shipper, MD;  Location: [facility];  Service: Endoscopy;  Laterality: N/A;   use ercp scope   . ESOPHAGOGASTRODUODENOSCOPY (EGD) WITH PROPOFOL N/A 07/19/2018   Procedure: ESOPHAGOGASTRODUODENOSCOPY (EGD) WITH PROPOFOL;  Surgeon: Irene Shipper, MD;  Location: [facility];  Service: Endoscopy;  Laterality: N/A;  ERCP SCOPE  . UPPER GASTROINTESTINAL ENDOSCOPY     Allergies  Allergen Reactions  . Ondansetron Hcl Other (See Comments)    Headache   Current Meds  Medication Sig  . DULoxetine (CYMBALTA) 60 MG capsule Take 60 mg by mouth at bedtime.   Marland Kitchen HYDROmorphone (DILAUDID) 4 MG tablet 8 mg every 4 (four) hours as needed.   . OXYCONTIN 60 MG 12 hr tablet 60 mg every 8 (eight) hours.   . pregabalin (LYRICA) 100 MG capsule Take 100 mg by mouth 3 (three) times daily.   . [DISCONTINUED] OLANZapine (ZYPREXA) 10 MG tablet Take 10 mg by mouth at bedtime.   . [DISCONTINUED] omeprazole (PRILOSEC) 40 MG capsule Take 1 capsule (40 mg total) by mouth daily. (Patient taking differently: Take 40 mg by mouth at bedtime. )  . [DISCONTINUED] oxyCODONE (OXYCONTIN) 40 mg 12 hr tablet Take 40 mg by mouth 3 (three) times daily.   . [DISCONTINUED] Oxycodone HCl 10 MG TABS Take 1-2 tablets by mouth every 4 (four) hours as needed (severe pain).   . [  DISCONTINUED] SORAfenib (NEXAVAR) 200 MG tablet Take 400 mg by mouth daily.   . [DISCONTINUED] traZODone (DESYREL) 50 MG tablet Take 50 mg by mouth at bedtime as needed for sleep.   Social History   Tobacco Use  . Smoking status: Former Smoker    Types: Cigarettes    Last attempt to quit: 01/01/2008    Years since quitting: 10.9  . Smokeless tobacco: Never Used  Substance Use Topics  . Alcohol use: No    Alcohol/week: 0.0 standard drinks   Family History  Problem Relation Age of Onset  . Cancer  Mother        GI cancer  . Drug abuse Mother   . Colon polyps Mother   . Other Mother        Gardners syndrome  . Lung cancer Maternal Grandmother   . Colon cancer Maternal Grandfather   . Colon polyps Maternal Grandfather   . Other Maternal Grandfather        Gardners syndrome  . Brain cancer Paternal Grandmother   . Liver cancer Paternal Grandmother      Review of Systems  Constitutional: Negative for chills, fatigue and fever.  Respiratory: Negative for cough and shortness of breath.   Cardiovascular: Negative for chest pain.  Psychiatric/Behavioral: The patient is nervous/anxious (has been stable; great support system).     Objective:  There were no vitals taken for this visit.      BP Readings from Last 3 Encounters:  07/19/18 (!) 106/58  06/28/18 114/76  06/18/18 100/68   Wt Readings from Last 3 Encounters:  06/28/18 286 lb (129.7 kg)  06/18/18 296 lb 9.6 oz (134.5 kg)  04/30/18 293 lb 12.8 oz (133.3 kg)    EXAM:  GENERAL: alert, oriented, appears well and in no acute distress  HEENT: atraumatic, conjunctiva clear, no obvious abnormalities on inspection of external nose and ears  NECK: normal movements of the head and neck  LUNGS: on inspection no signs of respiratory distress, breathing rate appears normal, no obvious gross SOB, gasping or wheezing  CV: no obvious cyanosis  MS: moves all visible extremities without noticeable abnormality  PSYCH/NEURO: pleasant and cooperative, no obvious depression or anxiety, speech and thought processing grossly intact  SKIN: No obvious head or neck abnormalities  Assessment/Plan 1. Desmoid tumor of abdomen Following up with specialist.  He is in a clinical trial.  He seems to be handling associated symptoms and complications well.  He has a good team of specialists helping him in terms of pain management as well as psycho social aspects.  2. 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  3. GAD (generalized anxiety  disorder) Stable.  He is following with behavioral specialist.  Encouraged him to reach out if he needs anything additional to help with mood.    I discussed the assessment and treatment plan with the patient. The patient was provided an opportunity to ask questions and all were answered. The patient agreed with the plan and demonstrated an understanding of the instructions.   The patient was advised to call back or seek an in-person evaluation if the symptoms worsen or if the condition fails to improve as anticipated.  I provided 15 minutes of non-face-to-face time during this encounter. Return if symptoms worsen or fail to improve; consider physical in fall-winter.   Micheline Rough, MD

## 2018-12-24 DIAGNOSIS — F1190 Opioid use, unspecified, uncomplicated: Secondary | ICD-10-CM | POA: Diagnosis not present

## 2018-12-24 DIAGNOSIS — D481 Neoplasm of uncertain behavior of connective and other soft tissue: Secondary | ICD-10-CM | POA: Diagnosis not present

## 2018-12-24 DIAGNOSIS — Z79891 Long term (current) use of opiate analgesic: Secondary | ICD-10-CM | POA: Diagnosis not present

## 2018-12-24 DIAGNOSIS — G894 Chronic pain syndrome: Secondary | ICD-10-CM | POA: Diagnosis not present

## 2018-12-25 ENCOUNTER — Ambulatory Visit (INDEPENDENT_AMBULATORY_CARE_PROVIDER_SITE_OTHER): Payer: BLUE CROSS/BLUE SHIELD | Admitting: Professional

## 2018-12-25 DIAGNOSIS — F321 Major depressive disorder, single episode, moderate: Secondary | ICD-10-CM | POA: Diagnosis not present

## 2018-12-26 DIAGNOSIS — Z006 Encounter for examination for normal comparison and control in clinical research program: Secondary | ICD-10-CM | POA: Diagnosis not present

## 2018-12-26 DIAGNOSIS — R1011 Right upper quadrant pain: Secondary | ICD-10-CM | POA: Diagnosis not present

## 2018-12-26 DIAGNOSIS — Z9049 Acquired absence of other specified parts of digestive tract: Secondary | ICD-10-CM | POA: Diagnosis not present

## 2018-12-26 DIAGNOSIS — D481 Neoplasm of uncertain behavior of connective and other soft tissue: Secondary | ICD-10-CM | POA: Diagnosis not present

## 2018-12-27 ENCOUNTER — Ambulatory Visit (INDEPENDENT_AMBULATORY_CARE_PROVIDER_SITE_OTHER): Payer: BLUE CROSS/BLUE SHIELD | Admitting: Professional

## 2018-12-27 DIAGNOSIS — F321 Major depressive disorder, single episode, moderate: Secondary | ICD-10-CM

## 2018-12-31 ENCOUNTER — Ambulatory Visit (INDEPENDENT_AMBULATORY_CARE_PROVIDER_SITE_OTHER): Payer: BLUE CROSS/BLUE SHIELD | Admitting: Professional

## 2018-12-31 DIAGNOSIS — F321 Major depressive disorder, single episode, moderate: Secondary | ICD-10-CM | POA: Diagnosis not present

## 2019-01-03 ENCOUNTER — Ambulatory Visit: Payer: BLUE CROSS/BLUE SHIELD | Admitting: Professional

## 2019-01-03 DIAGNOSIS — Z006 Encounter for examination for normal comparison and control in clinical research program: Secondary | ICD-10-CM | POA: Diagnosis not present

## 2019-01-03 DIAGNOSIS — D481 Neoplasm of uncertain behavior of connective and other soft tissue: Secondary | ICD-10-CM | POA: Diagnosis not present

## 2019-01-08 ENCOUNTER — Ambulatory Visit: Payer: BLUE CROSS/BLUE SHIELD | Admitting: Professional

## 2019-01-09 ENCOUNTER — Ambulatory Visit (INDEPENDENT_AMBULATORY_CARE_PROVIDER_SITE_OTHER): Payer: BLUE CROSS/BLUE SHIELD | Admitting: Professional

## 2019-01-09 DIAGNOSIS — F321 Major depressive disorder, single episode, moderate: Secondary | ICD-10-CM

## 2019-01-16 ENCOUNTER — Ambulatory Visit (INDEPENDENT_AMBULATORY_CARE_PROVIDER_SITE_OTHER): Payer: BC Managed Care – PPO | Admitting: Professional

## 2019-01-16 DIAGNOSIS — F321 Major depressive disorder, single episode, moderate: Secondary | ICD-10-CM | POA: Diagnosis not present

## 2019-01-20 ENCOUNTER — Ambulatory Visit (INDEPENDENT_AMBULATORY_CARE_PROVIDER_SITE_OTHER): Payer: BC Managed Care – PPO | Admitting: Professional

## 2019-01-20 ENCOUNTER — Ambulatory Visit: Payer: BLUE CROSS/BLUE SHIELD | Admitting: Psychology

## 2019-01-20 DIAGNOSIS — F321 Major depressive disorder, single episode, moderate: Secondary | ICD-10-CM

## 2019-01-27 ENCOUNTER — Ambulatory Visit (INDEPENDENT_AMBULATORY_CARE_PROVIDER_SITE_OTHER): Payer: Self-pay | Admitting: Professional

## 2019-01-27 DIAGNOSIS — F321 Major depressive disorder, single episode, moderate: Secondary | ICD-10-CM

## 2019-02-03 ENCOUNTER — Ambulatory Visit: Payer: BLUE CROSS/BLUE SHIELD | Admitting: Psychology

## 2019-02-03 ENCOUNTER — Ambulatory Visit: Payer: BC Managed Care – PPO | Admitting: Professional

## 2019-02-10 ENCOUNTER — Ambulatory Visit: Payer: Self-pay | Admitting: Professional

## 2019-02-17 ENCOUNTER — Ambulatory Visit: Payer: BLUE CROSS/BLUE SHIELD | Admitting: Psychology

## 2019-02-17 ENCOUNTER — Ambulatory Visit: Payer: Self-pay | Admitting: Professional

## 2019-02-20 DIAGNOSIS — G4700 Insomnia, unspecified: Secondary | ICD-10-CM | POA: Diagnosis not present

## 2019-02-20 DIAGNOSIS — M792 Neuralgia and neuritis, unspecified: Secondary | ICD-10-CM | POA: Diagnosis not present

## 2019-02-20 DIAGNOSIS — G893 Neoplasm related pain (acute) (chronic): Secondary | ICD-10-CM | POA: Diagnosis not present

## 2019-02-20 DIAGNOSIS — Z515 Encounter for palliative care: Secondary | ICD-10-CM | POA: Diagnosis not present

## 2019-02-24 ENCOUNTER — Ambulatory Visit: Payer: Self-pay | Admitting: Professional

## 2019-03-03 ENCOUNTER — Ambulatory Visit: Payer: Self-pay | Admitting: Professional

## 2019-03-05 DIAGNOSIS — R001 Bradycardia, unspecified: Secondary | ICD-10-CM | POA: Diagnosis not present

## 2019-03-05 DIAGNOSIS — I498 Other specified cardiac arrhythmias: Secondary | ICD-10-CM | POA: Diagnosis not present

## 2019-03-05 DIAGNOSIS — Z006 Encounter for examination for normal comparison and control in clinical research program: Secondary | ICD-10-CM | POA: Diagnosis not present

## 2019-03-05 DIAGNOSIS — D481 Neoplasm of uncertain behavior of connective and other soft tissue: Secondary | ICD-10-CM | POA: Diagnosis not present

## 2019-03-10 ENCOUNTER — Ambulatory Visit: Payer: Self-pay | Admitting: Professional

## 2019-03-24 ENCOUNTER — Ambulatory Visit: Payer: Self-pay | Admitting: Professional

## 2019-11-13 DIAGNOSIS — G893 Neoplasm related pain (acute) (chronic): Secondary | ICD-10-CM | POA: Diagnosis not present

## 2019-11-13 DIAGNOSIS — D481 Neoplasm of uncertain behavior of connective and other soft tissue: Secondary | ICD-10-CM | POA: Diagnosis not present

## 2019-11-13 DIAGNOSIS — R9431 Abnormal electrocardiogram [ECG] [EKG]: Secondary | ICD-10-CM | POA: Diagnosis not present

## 2019-11-13 DIAGNOSIS — Z79899 Other long term (current) drug therapy: Secondary | ICD-10-CM | POA: Diagnosis not present

## 2019-11-13 DIAGNOSIS — R1909 Other intra-abdominal and pelvic swelling, mass and lump: Secondary | ICD-10-CM | POA: Diagnosis not present

## 2019-11-13 DIAGNOSIS — Z87891 Personal history of nicotine dependence: Secondary | ICD-10-CM | POA: Diagnosis not present

## 2019-11-13 DIAGNOSIS — I498 Other specified cardiac arrhythmias: Secondary | ICD-10-CM | POA: Diagnosis not present

## 2019-11-13 DIAGNOSIS — Z006 Encounter for examination for normal comparison and control in clinical research program: Secondary | ICD-10-CM | POA: Diagnosis not present

## 2019-11-28 DIAGNOSIS — Z515 Encounter for palliative care: Secondary | ICD-10-CM | POA: Diagnosis not present

## 2019-11-28 DIAGNOSIS — F064 Anxiety disorder due to known physiological condition: Secondary | ICD-10-CM | POA: Diagnosis not present

## 2019-11-28 DIAGNOSIS — K5903 Drug induced constipation: Secondary | ICD-10-CM | POA: Diagnosis not present

## 2019-11-28 DIAGNOSIS — G893 Neoplasm related pain (acute) (chronic): Secondary | ICD-10-CM | POA: Diagnosis not present

## 2019-12-04 DIAGNOSIS — Z79899 Other long term (current) drug therapy: Secondary | ICD-10-CM | POA: Diagnosis not present

## 2019-12-04 DIAGNOSIS — R001 Bradycardia, unspecified: Secondary | ICD-10-CM | POA: Diagnosis not present

## 2019-12-04 DIAGNOSIS — Z87891 Personal history of nicotine dependence: Secondary | ICD-10-CM | POA: Diagnosis not present

## 2019-12-04 DIAGNOSIS — D481 Neoplasm of uncertain behavior of connective and other soft tissue: Secondary | ICD-10-CM | POA: Diagnosis not present

## 2019-12-04 DIAGNOSIS — Z006 Encounter for examination for normal comparison and control in clinical research program: Secondary | ICD-10-CM | POA: Diagnosis not present

## 2019-12-11 DIAGNOSIS — Z006 Encounter for examination for normal comparison and control in clinical research program: Secondary | ICD-10-CM | POA: Diagnosis not present

## 2019-12-11 DIAGNOSIS — Z87891 Personal history of nicotine dependence: Secondary | ICD-10-CM | POA: Diagnosis not present

## 2019-12-11 DIAGNOSIS — R55 Syncope and collapse: Secondary | ICD-10-CM | POA: Diagnosis not present

## 2019-12-11 DIAGNOSIS — D481 Neoplasm of uncertain behavior of connective and other soft tissue: Secondary | ICD-10-CM | POA: Diagnosis not present

## 2019-12-11 DIAGNOSIS — R9431 Abnormal electrocardiogram [ECG] [EKG]: Secondary | ICD-10-CM | POA: Diagnosis not present

## 2019-12-11 DIAGNOSIS — R519 Headache, unspecified: Secondary | ICD-10-CM | POA: Diagnosis not present

## 2019-12-11 DIAGNOSIS — Z79899 Other long term (current) drug therapy: Secondary | ICD-10-CM | POA: Diagnosis not present

## 2019-12-11 DIAGNOSIS — R112 Nausea with vomiting, unspecified: Secondary | ICD-10-CM | POA: Diagnosis not present

## 2019-12-18 DIAGNOSIS — D481 Neoplasm of uncertain behavior of connective and other soft tissue: Secondary | ICD-10-CM | POA: Diagnosis not present

## 2019-12-18 DIAGNOSIS — Z006 Encounter for examination for normal comparison and control in clinical research program: Secondary | ICD-10-CM | POA: Diagnosis not present

## 2019-12-18 DIAGNOSIS — Z87891 Personal history of nicotine dependence: Secondary | ICD-10-CM | POA: Diagnosis not present

## 2019-12-18 DIAGNOSIS — R112 Nausea with vomiting, unspecified: Secondary | ICD-10-CM | POA: Diagnosis not present

## 2019-12-18 DIAGNOSIS — Z79899 Other long term (current) drug therapy: Secondary | ICD-10-CM | POA: Diagnosis not present

## 2019-12-24 DIAGNOSIS — Z20822 Contact with and (suspected) exposure to covid-19: Secondary | ICD-10-CM | POA: Diagnosis not present

## 2019-12-25 DIAGNOSIS — Z87891 Personal history of nicotine dependence: Secondary | ICD-10-CM | POA: Diagnosis not present

## 2019-12-25 DIAGNOSIS — Z79899 Other long term (current) drug therapy: Secondary | ICD-10-CM | POA: Diagnosis not present

## 2019-12-25 DIAGNOSIS — D481 Neoplasm of uncertain behavior of connective and other soft tissue: Secondary | ICD-10-CM | POA: Diagnosis not present

## 2019-12-25 DIAGNOSIS — R110 Nausea: Secondary | ICD-10-CM | POA: Diagnosis not present

## 2019-12-25 DIAGNOSIS — Z006 Encounter for examination for normal comparison and control in clinical research program: Secondary | ICD-10-CM | POA: Diagnosis not present

## 2020-01-29 DIAGNOSIS — F17220 Nicotine dependence, chewing tobacco, uncomplicated: Secondary | ICD-10-CM | POA: Diagnosis not present

## 2020-01-29 DIAGNOSIS — D481 Neoplasm of uncertain behavior of connective and other soft tissue: Secondary | ICD-10-CM | POA: Diagnosis not present

## 2020-01-29 DIAGNOSIS — Z79899 Other long term (current) drug therapy: Secondary | ICD-10-CM | POA: Diagnosis not present

## 2020-01-29 DIAGNOSIS — G893 Neoplasm related pain (acute) (chronic): Secondary | ICD-10-CM | POA: Diagnosis not present

## 2020-01-29 DIAGNOSIS — Z006 Encounter for examination for normal comparison and control in clinical research program: Secondary | ICD-10-CM | POA: Diagnosis not present

## 2020-02-04 DIAGNOSIS — R519 Headache, unspecified: Secondary | ICD-10-CM | POA: Diagnosis not present

## 2020-02-04 DIAGNOSIS — R05 Cough: Secondary | ICD-10-CM | POA: Diagnosis not present

## 2020-02-04 DIAGNOSIS — J029 Acute pharyngitis, unspecified: Secondary | ICD-10-CM | POA: Diagnosis not present

## 2020-02-06 DIAGNOSIS — Z515 Encounter for palliative care: Secondary | ICD-10-CM | POA: Diagnosis not present

## 2020-02-06 DIAGNOSIS — G4701 Insomnia due to medical condition: Secondary | ICD-10-CM | POA: Diagnosis not present

## 2020-02-06 DIAGNOSIS — R630 Anorexia: Secondary | ICD-10-CM | POA: Diagnosis not present

## 2020-02-06 DIAGNOSIS — G893 Neoplasm related pain (acute) (chronic): Secondary | ICD-10-CM | POA: Diagnosis not present

## 2020-02-26 DIAGNOSIS — Z87891 Personal history of nicotine dependence: Secondary | ICD-10-CM | POA: Diagnosis not present

## 2020-02-26 DIAGNOSIS — D126 Benign neoplasm of colon, unspecified: Secondary | ICD-10-CM | POA: Diagnosis not present

## 2020-02-26 DIAGNOSIS — D481 Neoplasm of uncertain behavior of connective and other soft tissue: Secondary | ICD-10-CM | POA: Diagnosis not present

## 2020-02-26 DIAGNOSIS — I498 Other specified cardiac arrhythmias: Secondary | ICD-10-CM | POA: Diagnosis not present

## 2020-02-26 DIAGNOSIS — Z79899 Other long term (current) drug therapy: Secondary | ICD-10-CM | POA: Diagnosis not present

## 2020-02-26 DIAGNOSIS — R001 Bradycardia, unspecified: Secondary | ICD-10-CM | POA: Diagnosis not present

## 2020-02-26 DIAGNOSIS — R1031 Right lower quadrant pain: Secondary | ICD-10-CM | POA: Diagnosis not present

## 2020-02-26 DIAGNOSIS — G8929 Other chronic pain: Secondary | ICD-10-CM | POA: Diagnosis not present

## 2020-02-26 DIAGNOSIS — Z9049 Acquired absence of other specified parts of digestive tract: Secondary | ICD-10-CM | POA: Diagnosis not present

## 2020-02-26 DIAGNOSIS — Z006 Encounter for examination for normal comparison and control in clinical research program: Secondary | ICD-10-CM | POA: Diagnosis not present

## 2020-03-05 DIAGNOSIS — Z515 Encounter for palliative care: Secondary | ICD-10-CM | POA: Diagnosis not present

## 2020-03-05 DIAGNOSIS — G4701 Insomnia due to medical condition: Secondary | ICD-10-CM | POA: Diagnosis not present

## 2020-03-05 DIAGNOSIS — K5903 Drug induced constipation: Secondary | ICD-10-CM | POA: Diagnosis not present

## 2020-03-05 DIAGNOSIS — G893 Neoplasm related pain (acute) (chronic): Secondary | ICD-10-CM | POA: Diagnosis not present

## 2020-03-08 DIAGNOSIS — Z79899 Other long term (current) drug therapy: Secondary | ICD-10-CM | POA: Diagnosis not present

## 2020-03-29 DIAGNOSIS — Z515 Encounter for palliative care: Secondary | ICD-10-CM | POA: Diagnosis not present

## 2020-03-29 DIAGNOSIS — M792 Neuralgia and neuritis, unspecified: Secondary | ICD-10-CM | POA: Diagnosis not present

## 2020-03-31 DIAGNOSIS — Z20822 Contact with and (suspected) exposure to covid-19: Secondary | ICD-10-CM | POA: Diagnosis not present

## 2020-04-01 DIAGNOSIS — Z5111 Encounter for antineoplastic chemotherapy: Secondary | ICD-10-CM | POA: Diagnosis not present

## 2020-04-01 DIAGNOSIS — Z9049 Acquired absence of other specified parts of digestive tract: Secondary | ICD-10-CM | POA: Diagnosis not present

## 2020-04-01 DIAGNOSIS — Z006 Encounter for examination for normal comparison and control in clinical research program: Secondary | ICD-10-CM | POA: Diagnosis not present

## 2020-04-01 DIAGNOSIS — Z87891 Personal history of nicotine dependence: Secondary | ICD-10-CM | POA: Diagnosis not present

## 2020-04-01 DIAGNOSIS — D481 Neoplasm of uncertain behavior of connective and other soft tissue: Secondary | ICD-10-CM | POA: Diagnosis not present

## 2020-04-01 DIAGNOSIS — Z79899 Other long term (current) drug therapy: Secondary | ICD-10-CM | POA: Diagnosis not present

## 2020-04-09 ENCOUNTER — Other Ambulatory Visit: Payer: Self-pay

## 2020-04-12 ENCOUNTER — Other Ambulatory Visit: Payer: Self-pay

## 2020-04-12 ENCOUNTER — Ambulatory Visit (INDEPENDENT_AMBULATORY_CARE_PROVIDER_SITE_OTHER): Payer: BC Managed Care – PPO | Admitting: Family Medicine

## 2020-04-12 ENCOUNTER — Encounter: Payer: Self-pay | Admitting: Family Medicine

## 2020-04-12 VITALS — BP 118/60 | HR 90 | Temp 97.4°F | Wt 271.2 lb

## 2020-04-12 DIAGNOSIS — N529 Male erectile dysfunction, unspecified: Secondary | ICD-10-CM | POA: Diagnosis not present

## 2020-04-12 DIAGNOSIS — M79652 Pain in left thigh: Secondary | ICD-10-CM | POA: Diagnosis not present

## 2020-04-12 MED ORDER — SILDENAFIL CITRATE 100 MG PO TABS: 100.0000 mg | ORAL_TABLET | ORAL | 0 refills | Status: DC | PRN

## 2020-04-12 NOTE — Progress Notes (Signed)
° °  Subjective:    Patient ID: Jonathan Marsh, male    DOB: 02/26/1986, 34 y.o.   MRN: [id number]  HPI Here for one month of pain in the middle left thigh. He only has pain when he puts weight on the leg. No hx of trauma. No swelling. He has Gardners syndrome, and he is getting chemotherapy for 2 desmoid tumors in his abdomen. He is already taking multiple pain medications. He also asks for help with erections. Since he has been taking chemotherapy, these have been difficult to maintain.   Review of Systems  Constitutional: Negative.   Respiratory: Negative.   Cardiovascular: Negative.   Gastrointestinal: Positive for abdominal pain.  Musculoskeletal: Positive for myalgias.       Objective:   Physical Exam Constitutional:      Appearance: Normal appearance.     Comments: He walks with a slight limp   Cardiovascular:     Rate and Rhythm: Normal rate and regular rhythm.     Pulses: Normal pulses.     Heart sounds: Normal heart sounds.  Pulmonary:     Effort: Pulmonary effort is normal.     Breath sounds: Normal breath sounds.  Musculoskeletal:     Comments: The left thigh appears normal. No swelling or masses are felt. He is quite tender in the anterior portion of the middle thigh  Neurological:     Mental Status: He is alert.           Assessment & Plan:  Unexplained left thigh pain. Since patients with Gardners syndrome are prone to form benign bone tumors, we will send him for Xrays of the left femur. He will also try Viagra for the ED. Alysia Penna, MD

## 2020-04-13 ENCOUNTER — Ambulatory Visit (INDEPENDENT_AMBULATORY_CARE_PROVIDER_SITE_OTHER)
Admission: RE | Admit: 2020-04-13 | Discharge: 2020-04-13 | Disposition: A | Discharge status: Home / Self Care | Payer: BC Managed Care – PPO | Source: Ambulatory Visit | Attending: Family Medicine | Admitting: Family Medicine

## 2020-04-13 DIAGNOSIS — M79652 Pain in left thigh: Secondary | ICD-10-CM | POA: Diagnosis not present

## 2020-04-14 ENCOUNTER — Encounter: Payer: Self-pay | Admitting: Family Medicine

## 2020-04-14 DIAGNOSIS — M79652 Pain in left thigh: Secondary | ICD-10-CM

## 2020-04-29 DIAGNOSIS — T50905A Adverse effect of unspecified drugs, medicaments and biological substances, initial encounter: Secondary | ICD-10-CM | POA: Diagnosis not present

## 2020-04-29 DIAGNOSIS — D481 Neoplasm of uncertain behavior of connective and other soft tissue: Secondary | ICD-10-CM | POA: Diagnosis not present

## 2020-04-29 DIAGNOSIS — L270 Generalized skin eruption due to drugs and medicaments taken internally: Secondary | ICD-10-CM | POA: Diagnosis not present

## 2020-04-29 DIAGNOSIS — K625 Hemorrhage of anus and rectum: Secondary | ICD-10-CM | POA: Diagnosis not present

## 2020-04-29 DIAGNOSIS — R197 Diarrhea, unspecified: Secondary | ICD-10-CM | POA: Diagnosis not present

## 2020-05-12 ENCOUNTER — Other Ambulatory Visit: Payer: Self-pay

## 2020-05-12 ENCOUNTER — Ambulatory Visit
Admission: RE | Admit: 2020-05-12 | Discharge: 2020-05-12 | Disposition: A | Discharge status: Home / Self Care | Payer: BC Managed Care – PPO | Source: Ambulatory Visit | Attending: Family Medicine | Admitting: Family Medicine

## 2020-05-12 DIAGNOSIS — M79652 Pain in left thigh: Secondary | ICD-10-CM

## 2020-05-12 DIAGNOSIS — M79605 Pain in left leg: Secondary | ICD-10-CM | POA: Diagnosis not present

## 2020-05-21 DIAGNOSIS — T50905A Adverse effect of unspecified drugs, medicaments and biological substances, initial encounter: Secondary | ICD-10-CM | POA: Diagnosis not present

## 2020-05-21 DIAGNOSIS — L309 Dermatitis, unspecified: Secondary | ICD-10-CM | POA: Diagnosis not present

## 2020-05-21 DIAGNOSIS — R209 Unspecified disturbances of skin sensation: Secondary | ICD-10-CM | POA: Diagnosis not present

## 2020-05-27 DIAGNOSIS — E279 Disorder of adrenal gland, unspecified: Secondary | ICD-10-CM | POA: Diagnosis not present

## 2020-05-27 DIAGNOSIS — D481 Neoplasm of uncertain behavior of connective and other soft tissue: Secondary | ICD-10-CM | POA: Diagnosis not present

## 2020-05-27 DIAGNOSIS — L270 Generalized skin eruption due to drugs and medicaments taken internally: Secondary | ICD-10-CM | POA: Diagnosis not present

## 2020-05-27 DIAGNOSIS — K3189 Other diseases of stomach and duodenum: Secondary | ICD-10-CM | POA: Diagnosis not present

## 2020-05-27 DIAGNOSIS — K429 Umbilical hernia without obstruction or gangrene: Secondary | ICD-10-CM | POA: Diagnosis not present

## 2020-06-01 ENCOUNTER — Encounter: Payer: Self-pay | Admitting: Emergency Medicine

## 2020-06-01 ENCOUNTER — Other Ambulatory Visit: Payer: Self-pay

## 2020-06-01 ENCOUNTER — Emergency Department (INDEPENDENT_AMBULATORY_CARE_PROVIDER_SITE_OTHER): Payer: BC Managed Care – PPO

## 2020-06-01 ENCOUNTER — Emergency Department
Admission: EM | Admit: 2020-06-01 | Discharge: 2020-06-01 | Disposition: A | Discharge status: Home / Self Care | Payer: BC Managed Care – PPO | Source: Home / Self Care

## 2020-06-01 DIAGNOSIS — R509 Fever, unspecified: Secondary | ICD-10-CM

## 2020-06-01 DIAGNOSIS — R058 Other specified cough: Secondary | ICD-10-CM | POA: Diagnosis not present

## 2020-06-01 DIAGNOSIS — R059 Cough, unspecified: Secondary | ICD-10-CM | POA: Diagnosis not present

## 2020-06-01 DIAGNOSIS — J209 Acute bronchitis, unspecified: Secondary | ICD-10-CM

## 2020-06-01 DIAGNOSIS — L209 Atopic dermatitis, unspecified: Secondary | ICD-10-CM | POA: Diagnosis not present

## 2020-06-01 MED ORDER — CEFDINIR 300 MG PO CAPS: 600.0000 mg | ORAL_CAPSULE | Freq: Every day | ORAL | 0 refills | Status: DC

## 2020-06-01 MED ORDER — PROMETHAZINE-DM 6.25-15 MG/5ML PO SYRP: 5.0000 mL | ORAL_SOLUTION | Freq: Four times a day (QID) | ORAL | 0 refills | Status: DC | PRN

## 2020-06-01 MED ORDER — ALBUTEROL SULFATE HFA 108 (90 BASE) MCG/ACT IN AERS
1.0000 | INHALATION_SPRAY | Freq: Four times a day (QID) | RESPIRATORY_TRACT | 0 refills | Status: DC | PRN
Start: 2020-06-01 — End: 2022-03-27

## 2020-06-01 NOTE — ED Triage Notes (Signed)
Congestion, fever, headache, body aches, x 3 days. Worse today. Patient is unable to get vaccine, he is a chemo patient.

## 2020-06-01 NOTE — ED Provider Notes (Signed)
Jonathan Marsh [facility]    CSN: [id number] Arrival date & time: 06/01/20  1054      History   Chief Complaint Chief Complaint  Patient presents with   Nasal Congestion    HPI Jonathan Marsh is a 34 y.o. male.   HPI   Jonathan Marsh presents with symptoms shortness of breath, cough, congestion, and fever TMAX 100. F. Patient is currently receiving chemotherapy and is immunocompromised. He is unvaccinated against COVID and Influenza due to immunocompromised status. Sick contact is spouse recently treated for similar symptoms. Cough is productive with discolored sputum. He is taking tylenol and motrin and has not had fever in >24 hours. Endorses generalized body aches.  Past Medical History:  Diagnosis Date   Ampullary adenoma    Desmoid tumor of abdomen    Gardner syndrome    Gastric polyp    GERD (gastroesophageal reflux disease)    Headache    mild - stress related   Multiple gastric polyps    Polyp of duodenum     Patient Active Problem List   Diagnosis Date Noted   Desmoid tumor of abdomen    Adenomatous duodenal polyp    Ampullary adenoma    Multiple gastric polyps    Gardner's syndrome 08/18/2014   Stress headaches 08/18/2014   GAD (generalized anxiety disorder) 08/18/2014    Past Surgical History:  Procedure Laterality Date   APPENDECTOMY     COLECTOMY     age 36 due to gardners syndrome- has j pouch reconstruction   COLONOSCOPY     ESOPHAGOGASTRODUODENOSCOPY N/A 03/30/2015   Procedure: ESOPHAGOGASTRODUODENOSCOPY (EGD);  Surgeon: Irene Shipper, MD;  Location: [facility];  Service: Endoscopy;  Laterality: N/A;  need side viewing scope   ESOPHAGOGASTRODUODENOSCOPY N/A 01/18/2016   Procedure: ESOPHAGOGASTRODUODENOSCOPY (EGD);  Surgeon: Irene Shipper, MD;  Location: [facility];  Service: Endoscopy;  Laterality: N/A;   use ercp scope    ESOPHAGOGASTRODUODENOSCOPY (EGD) WITH PROPOFOL N/A 07/19/2018   Procedure: ESOPHAGOGASTRODUODENOSCOPY (EGD)  WITH PROPOFOL;  Surgeon: Irene Shipper, MD;  Location: [facility];  Service: Endoscopy;  Laterality: N/A;  ERCP SCOPE   UPPER GASTROINTESTINAL ENDOSCOPY         Home Medications    Prior to Admission medications   Medication Sig Start Date End Date Taking? Authorizing Provider  clobetasol cream (TEMOVATE) 7.35 % Apply 1 application topically 2 (two) times daily.   Yes [provider]  predniSONE (DELTASONE) 10 MG tablet Take 10 mg by mouth daily with breakfast.   Yes [provider]  prochlorperazine (COMPAZINE) 25 MG suppository Place 25 mg rectally every 12 (twelve) hours as needed for nausea or vomiting.   Yes [provider]  DULoxetine (CYMBALTA) 60 MG capsule Take 60 mg by mouth 2 (two) times daily.  07/02/17 03/14/19  [provider]  ketorolac (TORADOL) 10 MG tablet Take 10 mg by mouth every 6 (six) hours as needed.    [provider]  midodrine (PROAMATINE) 2.5 MG tablet Take by mouth. 07/24/19 07/23/20  [provider]  OLANZapine (ZYPREXA) 10 MG tablet  09/17/18   [provider]  oxycodone (ROXICODONE) 30 MG immediate release tablet Take 30 mg by mouth every 3 (three) hours.    [provider]  OXYCONTIN 60 MG 12 hr tablet 60 mg every 8 (eight) hours.  12/09/18   [provider]  pregabalin (LYRICA) 100 MG capsule Take 200 mg by mouth 3 (three) times daily.  04/05/18  [provider]  sildenafil (VIAGRA) 100 MG tablet Take 1 tablet (100 mg total) by mouth as needed for erectile dysfunction. 04/12/20   Laurey Morale, MD  zolpidem (AMBIEN) 10 MG tablet Take 10 mg by mouth at bedtime as needed for sleep.    [provider]    Family History Family History  Problem Relation Age of Onset   Cancer Mother        GI cancer   Drug abuse Mother    Colon polyps Mother    Other Mother        Gardners syndrome   Lung cancer Maternal Grandmother    Colon cancer Maternal Grandfather     Colon polyps Maternal Grandfather    Other Maternal Grandfather        Gardners syndrome   Brain cancer Paternal Grandmother    Liver cancer Paternal Grandmother     Social History Social History   Tobacco Use   Smoking status: Former Smoker    Types: Cigarettes    Quit date: 01/01/2008    Years since quitting: 12.4   Smokeless tobacco: Never Used  Vaping Use   Vaping Use: Never used  Substance Use Topics   Alcohol use: No    Alcohol/week: 0.0 standard drinks   Drug use: No     Allergies   Ondansetron hcl Review of Systems Review of Systems Pertinent negatives listed in HPI  Physical Exam Triage Vital Signs ED Triage Vitals  Enc Vitals Group     BP 06/01/20 1123 111/75     Pulse Rate 06/01/20 1123 79     Resp --      Temp 06/01/20 1123 (!) 97.5 F (36.4 C)     Temp Source 06/01/20 1123 Oral     SpO2 06/01/20 1123 97 %     Weight 06/01/20 1125 250 lb (113.4 kg)     Height 06/01/20 1125 6\' 4"  (1.93 m)     Head Circumference --      Peak Flow --      Pain Score 06/01/20 1124 4     Pain Loc --      Pain Edu? --      Excl. in [place]? --    No data found.  Updated Vital Signs BP 111/75 (BP Location: Right Arm)    Pulse 79    Temp (!) 97.5 F (36.4 C) (Oral)    Ht 6\' 4"  (1.93 m)    Wt 250 lb (113.4 kg)    SpO2 97%    BMI 30.43 kg/m   Visual Acuity Right Eye Distance:   Left Eye Distance:   Bilateral Distance:    Right Eye Near:   Left Eye Near:    Bilateral Near:     Physical Exam Constitutional:      Appearance: He is obese. He is ill-appearing. He is not toxic-appearing.  HENT:     Head: Normocephalic.  Cardiovascular:     Rate and Rhythm: Normal rate and regular rhythm.  Pulmonary:     Effort: Pulmonary effort is normal.     Breath sounds: Wheezing and rhonchi present.  Musculoskeletal:     Cervical back: Normal range of motion and neck supple.  Skin:    General: Skin is warm.     Capillary Refill: Capillary refill takes less than 2  seconds.  Neurological:     General: No focal deficit present.     Mental Status: He is alert.  Psychiatric:  Mood and Affect: Mood normal.        Thought Content: Thought content normal.        Judgment: Judgment normal.      UC Treatments / Results  Labs (all labs ordered are listed, but only abnormal results are displayed) Labs Reviewed - No data to display  EKG   Radiology DG Chest 2 View  Result Date: 06/01/2020 CLINICAL DATA:  Fever, cough. EXAM: CHEST - 2 VIEW COMPARISON:  None. FINDINGS: The heart size and mediastinal contours are within normal limits. Both lungs are clear. No pneumothorax or pleural effusion is noted. Right internal jugular Port-A-Cath is noted with distal tip in expected position of cavoatrial junction. The visualized skeletal structures are unremarkable. IMPRESSION: No active cardiopulmonary disease. Electronically Signed   By: Marijo Conception M.D.   On: 06/01/2020 12:27    Procedures Procedures (including critical care time)  Medications Ordered in UC Medications - No data to display  Initial Impression / Assessment and Plan / UC Course  I have reviewed the triage vital signs and the nursing notes.  Pertinent labs & imaging results that were available during my care of the patient were reviewed by me and considered in my medical decision making (see chart for details).      Patient presents today with symptoms of productive cough, fever, chest congestion with shortness of breath. Chest x-ray was negative for any acute pneumonia. Treating for acute bronchitis while also obtaining a Covid/RSV/influenza viral panel. Treatment per discharge instructions. Refrain from placing on prednisone as patient is currently undergoing chemotherapy treatment. Given overall immune system patient is at high risk for development of pneumonia or complications related to a respiratory illness. Red flags discussed in detail with patient. He verbalized understanding  and agreement with plan.  Final Clinical Impressions(s) / UC Diagnoses   Final diagnoses:  Productive cough  Fever, unspecified  Acute bronchitis, unspecified organism     Discharge Instructions     Your chest x-ray is negative for pneumonia.  I am treating you today for acute bronchitis.  If any of your symptoms worsen or do not improve with prescribed therapy follow-up with primary care for evaluation.  If you develop any severe shortness of breath , chest tightness or uncontrolled fever, go immediately to the emergency department for evaluation.    ED Prescriptions    Medication Sig Dispense Auth. Provider   cefdinir (OMNICEF) 300 MG capsule Take 2 capsules (600 mg total) by mouth daily. 20 capsule Scot Jun, FNP   albuterol (VENTOLIN HFA) 108 (90 Base) MCG/ACT inhaler Inhale 1-2 puffs into the lungs every 6 (six) hours as needed for wheezing or shortness of breath. 8 g Scot Jun, FNP   promethazine-dextromethorphan (PROMETHAZINE-DM) 6.25-15 MG/5ML syrup Take 5 mLs by mouth 4 (four) times daily as needed for cough. 140 mL Scot Jun, FNP     PDMP not reviewed this encounter.   Scot Jun, FNP 06/01/20 1452

## 2020-06-01 NOTE — Discharge Instructions (Addendum)
Your chest x-ray is negative for pneumonia.  I am treating you today for acute bronchitis.  If any of your symptoms worsen or do not improve with prescribed therapy follow-up with primary care for evaluation.  If you develop any severe shortness of breath , chest tightness or uncontrolled fever, go immediately to the emergency department for evaluation.

## 2020-06-07 ENCOUNTER — Ambulatory Visit (INDEPENDENT_AMBULATORY_CARE_PROVIDER_SITE_OTHER): Payer: BC Managed Care – PPO | Admitting: Internal Medicine

## 2020-06-07 ENCOUNTER — Encounter: Payer: Self-pay | Admitting: Internal Medicine

## 2020-06-07 VITALS — BP 130/80 | HR 87 | Ht 78.0 in | Wt 253.0 lb

## 2020-06-07 DIAGNOSIS — D481 Neoplasm of uncertain behavior of connective and other soft tissue: Secondary | ICD-10-CM

## 2020-06-07 DIAGNOSIS — R1903 Right lower quadrant abdominal swelling, mass and lump: Secondary | ICD-10-CM

## 2020-06-07 DIAGNOSIS — Q8789 Other specified congenital malformation syndromes, not elsewhere classified: Secondary | ICD-10-CM

## 2020-06-07 DIAGNOSIS — K602 Anal fissure, unspecified: Secondary | ICD-10-CM | POA: Diagnosis not present

## 2020-06-07 DIAGNOSIS — Z515 Encounter for palliative care: Secondary | ICD-10-CM | POA: Diagnosis not present

## 2020-06-07 DIAGNOSIS — K625 Hemorrhage of anus and rectum: Secondary | ICD-10-CM

## 2020-06-07 DIAGNOSIS — M792 Neuralgia and neuritis, unspecified: Secondary | ICD-10-CM | POA: Diagnosis not present

## 2020-06-07 DIAGNOSIS — G4701 Insomnia due to medical condition: Secondary | ICD-10-CM | POA: Diagnosis not present

## 2020-06-07 DIAGNOSIS — D48119 Desmoid tumor of unspecified site: Secondary | ICD-10-CM

## 2020-06-07 DIAGNOSIS — G893 Neoplasm related pain (acute) (chronic): Secondary | ICD-10-CM | POA: Diagnosis not present

## 2020-06-07 MED ORDER — DILTIAZEM GEL 2 %: CUTANEOUS | 1 refills | Status: DC

## 2020-06-07 NOTE — Patient Instructions (Signed)
We have sent a prescription for Diltiazem gel to [facility]. You should apply a pea size amount to your rectum three times daily x 6-8 weeks.  [facility]'s information is below: Address: [address] [place], [place] [address]  [phone]  *Please DO NOT go directly from our office to pick up this medication! Give the pharmacy 1 day to process the prescription as this is compounded and takes time to make.  Use 2 tablespoons of Metamucil daily  Please follow up in one year

## 2020-06-07 NOTE — Progress Notes (Signed)
HISTORY OF PRESENT ILLNESS:  Jonathan Marsh is a 34 y.o. 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  Last evaluation at [facility] 11 days ago.  Currently receiving liposomal doxorubicin complicated by significant drug rash for which she is on steroids.  Recently in the emergency room with bronchitis (Covid testing pending).  Presents today with chief complaint of rectal bleeding.  This has been going on about 2 months.  He describes red blood with each bowel movement.  Hemoglobin from May 27, 2020 was stable at 13.6.  There is some associated rectal burning discomfort at times.  Also describes a sensation of incomplete evacuation of his bowels.  No nausea or vomiting.  He moves his bowels daily.  Surveillance endoscopy with side-viewing scope was December 2018.  Multiple small polyps and unremarkable ampullary region post ampullectomy.  Routine follow-up 1 year suggested.  REVIEW OF SYSTEMS:  All non-GI ROS negative unless otherwise stated in the HPI except for night sweats, sleeping problems, fatigue  Past Medical History:  Diagnosis Date  . Ampullary adenoma   . Desmoid tumor of abdomen   . Gardner syndrome   . Gastric polyp   . GERD (gastroesophageal reflux disease)   . Headache    mild - stress related  . Multiple gastric polyps   . Polyp of duodenum     Past Surgical History:  Procedure Laterality Date  . APPENDECTOMY    . COLECTOMY     age 12 due to gardners syndrome- has j pouch reconstruction  . COLONOSCOPY    . ESOPHAGOGASTRODUODENOSCOPY N/A 03/30/2015   Procedure: ESOPHAGOGASTRODUODENOSCOPY (EGD);  Surgeon: Irene Shipper, MD;  Location: [facility];  Service: Endoscopy;  Laterality: N/A;  need side viewing scope  . ESOPHAGOGASTRODUODENOSCOPY N/A 01/18/2016   Procedure: ESOPHAGOGASTRODUODENOSCOPY  (EGD);  Surgeon: Irene Shipper, MD;  Location: [facility];  Service: Endoscopy;  Laterality: N/A;   use ercp scope   . ESOPHAGOGASTRODUODENOSCOPY (EGD) WITH PROPOFOL N/A 07/19/2018   Procedure: ESOPHAGOGASTRODUODENOSCOPY (EGD) WITH PROPOFOL;  Surgeon: Irene Shipper, MD;  Location: [facility];  Service: Endoscopy;  Laterality: N/A;  ERCP SCOPE  . UPPER GASTROINTESTINAL ENDOSCOPY      Social History Keniel Ralston  reports that he quit smoking about 12 years ago. His smoking use included cigarettes. He has never used smokeless tobacco. He reports that he does not drink alcohol and does not use drugs.  family history includes Brain cancer in his paternal grandmother; Cancer in his mother; Colon cancer in his maternal grandfather; Colon polyps in his maternal grandfather and mother; Drug abuse in his mother; Liver cancer in his paternal grandmother; Lung cancer in his maternal grandmother; Other in his maternal grandfather and mother.  Allergies  Allergen Reactions  . Ondansetron Hcl Other (See Comments)    Headache       PHYSICAL EXAMINATION: Vital signs: BP 130/80   Pulse 87   Ht 6\' 6"  (1.981 m)   Wt 253 lb (114.8 kg)   SpO2 98%   BMI 29.24 kg/m   Constitutional: generally well-appearing, no acute distress Psychiatric: alert and oriented x3, cooperative Eyes: extraocular movements intact, anicteric, conjunctiva pink Mouth: Mask Neck: supple no lymphadenopathy Cardiovascular: heart regular rate and rhythm, no murmur Lungs: clear to auscultation bilaterally Abdomen: soft, somewhat tender palpable mass right lower quadrant, nondistended, no obvious ascites, no peritoneal signs, normal bowel  sounds, no organomegaly Rectal: Posterior tenderness.  Blood on examining glove Extremities: no lower extremity edema bilaterally Skin: no lesions on visible extremities Neuro: No focal deficits.  Cranial nerves intact  ANOSCOPY: Friable mucosa with contact bleeding.  Appears to have posterior  fissure with bleeding.  No obvious mass  ASSESSMENT:  1.  Anal fissure.  Friable mucosa 2.  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  Intra-abdominal desmoid tumor, progressive.  Now on liposomal doxorubicin.  Followed at [facility] 4.  History of ampullary adenoma status post endoscopic resection.  Last surveillance endoscopy December 2019  PLAN:  1.  Metamucil 2 tablespoons daily 2.  2% diltiazem ointment.  Apply 5 times daily as instructed 3.  Contact the office for persistent problems bleeding 4.  Otherwise, we will plan routine follow-up 1 year.  We will hold off on his surveillance upper endoscopy until that time see how he is doing with management of his desmoid tumor

## 2020-06-11 DIAGNOSIS — L309 Dermatitis, unspecified: Secondary | ICD-10-CM | POA: Diagnosis not present

## 2020-06-11 DIAGNOSIS — Z87891 Personal history of nicotine dependence: Secondary | ICD-10-CM | POA: Diagnosis not present

## 2020-06-11 DIAGNOSIS — Z5111 Encounter for antineoplastic chemotherapy: Secondary | ICD-10-CM | POA: Diagnosis not present

## 2020-06-11 DIAGNOSIS — D481 Neoplasm of uncertain behavior of connective and other soft tissue: Secondary | ICD-10-CM | POA: Diagnosis not present

## 2020-06-11 DIAGNOSIS — R17 Unspecified jaundice: Secondary | ICD-10-CM | POA: Diagnosis not present

## 2020-06-11 DIAGNOSIS — L270 Generalized skin eruption due to drugs and medicaments taken internally: Secondary | ICD-10-CM | POA: Diagnosis not present

## 2020-06-11 DIAGNOSIS — T380X5A Adverse effect of glucocorticoids and synthetic analogues, initial encounter: Secondary | ICD-10-CM | POA: Diagnosis not present

## 2020-06-23 ENCOUNTER — Other Ambulatory Visit: Payer: Self-pay | Admitting: Family Medicine

## 2021-01-31 ENCOUNTER — Encounter: Payer: Self-pay | Admitting: Family Medicine

## 2021-02-01 ENCOUNTER — Other Ambulatory Visit: Payer: Self-pay

## 2021-02-01 NOTE — Telephone Encounter (Signed)
I spoke with the pt and an visit was scheduled for 02/07/2021 with Dr. Ethlyn Gallery. Pt stated that he is having thoughts of harming himself and I informed him to seek emergency care as advised by PCP.

## 2021-02-01 NOTE — Telephone Encounter (Signed)
Pt stated he would seek emergency care and follow up with PCP.

## 2021-02-01 NOTE — Telephone Encounter (Signed)
Brittney our Practice Admin at [employer] called [place] police department to have a welfare check done on the pt. The police department stated that they would return a call to the office to provide an update.

## 2021-02-02 NOTE — Telephone Encounter (Signed)
Received a call from the [place] me that an officer was heading to Jonathan Marsh home and that they would access but never received a call back after this was completed.

## 2021-02-03 NOTE — Telephone Encounter (Signed)
[place]. Patient has visit with me tomorrow.

## 2021-02-04 ENCOUNTER — Ambulatory Visit: Payer: BC Managed Care – PPO | Admitting: Family Medicine

## 2021-03-25 ENCOUNTER — Emergency Department (INDEPENDENT_AMBULATORY_CARE_PROVIDER_SITE_OTHER)
Admission: RE | Admit: 2021-03-25 | Discharge: 2021-03-25 | Disposition: A | Discharge status: Home / Self Care | Payer: Medicare Other | Source: Ambulatory Visit

## 2021-03-25 ENCOUNTER — Other Ambulatory Visit: Payer: Self-pay

## 2021-03-25 VITALS — BP 96/62 | HR 74 | Temp 97.9°F | Resp 18 | Ht 77.0 in | Wt 245.0 lb

## 2021-03-25 DIAGNOSIS — M7989 Other specified soft tissue disorders: Secondary | ICD-10-CM

## 2021-03-25 MED ORDER — FUROSEMIDE 40 MG PO TABS: ORAL_TABLET | ORAL | 0 refills | Status: DC

## 2021-03-25 NOTE — Discharge Instructions (Addendum)
Advised patient to take medication as directed starting tomorrow, Saturday, 03/26/2021-Monday, 03/28/2021.  Advised/instructed patient if bilateral foot swelling/redness worsens to please go to nearest ED for further evaluation.

## 2021-03-25 NOTE — ED Triage Notes (Signed)
Bi-lateral feet swelling x 3 days. Red, swollen, painful got worse last night. Patient on Chemo x 5 years intermittently.

## 2021-03-25 NOTE — ED Provider Notes (Signed)
Jonathan Marsh C[facility]    CSN: [id number] Arrival date & time: 03/25/21  1740      History   Chief Complaint Chief Complaint  Patient presents with   Foot Swelling    HPI Jonathan Marsh is a 35 y.o. male.   HPI Pleasant 35 year old male presents with bilateral feet swelling for 3 days. PMH significant for Gardner syndrome, ampullary adenoma, and desmoid tumor of abdomen.  Patient reports last chemotherapy treatment was in January 2022.  Patient reports he is currently followed by PCP and oncology.  Past Medical History:  Diagnosis Date   Ampullary adenoma    Desmoid tumor of abdomen    Gardner syndrome    Gastric polyp    GERD (gastroesophageal reflux disease)    Headache    mild - stress related   Multiple gastric polyps    Polyp of duodenum     Patient Active Problem List   Diagnosis Date Noted   Desmoid tumor of abdomen    Adenomatous duodenal polyp    Ampullary adenoma    Multiple gastric polyps    Gardner's syndrome 08/18/2014   Stress headaches 08/18/2014   GAD (generalized anxiety disorder) 08/18/2014    Past Surgical History:  Procedure Laterality Date   APPENDECTOMY     COLECTOMY     age 32 due to gardners syndrome- has j pouch reconstruction   COLONOSCOPY     ESOPHAGOGASTRODUODENOSCOPY N/A 03/30/2015   Procedure: ESOPHAGOGASTRODUODENOSCOPY (EGD);  Surgeon: Irene Shipper, MD;  Location: [facility];  Service: Endoscopy;  Laterality: N/A;  need side viewing scope   ESOPHAGOGASTRODUODENOSCOPY N/A 01/18/2016   Procedure: ESOPHAGOGASTRODUODENOSCOPY (EGD);  Surgeon: Irene Shipper, MD;  Location: [facility];  Service: Endoscopy;  Laterality: N/A;   use ercp scope    ESOPHAGOGASTRODUODENOSCOPY (EGD) WITH PROPOFOL N/A 07/19/2018   Procedure: ESOPHAGOGASTRODUODENOSCOPY (EGD) WITH PROPOFOL;  Surgeon: Irene Shipper, MD;  Location: [facility];  Service: Endoscopy;  Laterality: N/A;  ERCP SCOPE   UPPER GASTROINTESTINAL ENDOSCOPY         Home Medications     Prior to Admission medications   Medication Sig Start Date End Date Taking? Authorizing Provider  ALPRAZolam Duanne Moron) 0.5 MG tablet Take 0.5 mg by mouth at bedtime as needed for anxiety.   Yes [provider]  busPIRone (BUSPAR) 15 MG tablet Take 15 mg by mouth 3 (three) times daily.   Yes [provider]  furosemide (LASIX) 40 MG tablet Take 1 tablet p.o. daily x 3 days. 03/25/21  Yes Eliezer Lofts, FNP  PARoxetine (PAXIL) 40 MG tablet Take 40 mg by mouth every morning.   Yes [provider]  albuterol (VENTOLIN HFA) 108 (90 Base) MCG/ACT inhaler Inhale 1-2 puffs into the lungs every 6 (six) hours as needed for wheezing or shortness of breath. 06/01/20   Scot Jun, FNP  diltiazem 2 % GEL Apply to the rectum five times a day 06/07/20   Irene Shipper, MD  OLANZapine [facility]) 10 MG tablet  09/17/18   [provider]  oxycodone (ROXICODONE) 30 MG immediate release tablet Take 30 mg by mouth every 3 (three) hours.    [provider]  OXYCONTIN 60 MG 12 hr tablet 60 mg every 8 (eight) hours.  12/09/18   [provider]  pregabalin (LYRICA) 100 MG capsule Take 200 mg by mouth 3 (three) times daily.  04/05/18   [provider]  prochlorperazine (COMPAZINE) 25 MG suppository Place 25 mg rectally every 12 (twelve)  hours as needed for nausea or vomiting.    [provider]  sildenafil (VIAGRA) 100 MG tablet Take 1 tablet (100 mg total) by mouth as needed for erectile dysfunction. 04/12/20   Laurey Morale, MD    Family History Family History  Problem Relation Age of Onset   Cancer Mother        GI cancer   Drug abuse Mother    Colon polyps Mother    Other Mother        Gardners syndrome   Lung cancer Maternal Grandmother    Colon cancer Maternal Grandfather    Colon polyps Maternal Grandfather    Other Maternal Grandfather        Gardners syndrome   Brain cancer Paternal Grandmother    Liver cancer Paternal  Grandmother     Social History Social History   Tobacco Use   Smoking status: Former    Types: Cigarettes    Quit date: 01/01/2008    Years since quitting: 13.2   Smokeless tobacco: Never  Vaping Use   Vaping Use: Never used  Substance Use Topics   Alcohol use: No    Alcohol/week: 0.0 standard drinks   Drug use: No     Allergies   Ondansetron hcl   Review of Systems Review of Systems  Musculoskeletal:  Positive for joint swelling.       Bilateral foot swelling x 3 days    Physical Exam Triage Vital Signs ED Triage Vitals  Enc Vitals Group     BP 03/25/21 1811 96/62     Pulse Rate 03/25/21 1811 74     Resp 03/25/21 1811 18     Temp 03/25/21 1811 97.9 F (36.6 C)     Temp Source 03/25/21 1811 Oral     SpO2 03/25/21 1811 96 %     Weight 03/25/21 1812 245 lb (111.1 kg)     Height 03/25/21 1812 '6\' 5"'$  (1.956 m)     Head Circumference --      Peak Flow --      Pain Score 03/25/21 1812 8     Pain Loc --      Pain Edu? --      Excl. in [place]? --    No data found.  Updated Vital Signs BP 96/62 (BP Location: Left Arm)   Pulse 74   Temp 97.9 F (36.6 C) (Oral)   Resp 18   Ht '6\' 5"'$  (1.956 m)   Wt 245 lb (111.1 kg)   SpO2 96%   BMI 29.05 kg/m      Physical Exam Vitals and nursing note reviewed.  Constitutional:      General: He is not in acute distress.    Appearance: Normal appearance. He is obese. He is not ill-appearing.  HENT:     Head: Normocephalic and atraumatic.     Mouth/Throat:     Mouth: Mucous membranes are moist.     Pharynx: Oropharynx is clear.  Eyes:     Extraocular Movements: Extraocular movements intact.     Conjunctiva/sclera: Conjunctivae normal.     Pupils: Pupils are equal, round, and reactive to light.  Cardiovascular:     Rate and Rhythm: Tachycardia present. Rhythm irregular.     Pulses: Normal pulses.     Heart sounds: Normal heart sounds. No murmur heard.   No friction rub. No gallop.  Pulmonary:     Effort: Pulmonary  effort is normal. No respiratory distress.  Breath sounds: Normal breath sounds. No stridor. No wheezing, rhonchi or rales.  Musculoskeletal:        General: Normal range of motion.     Cervical back: Normal range of motion and neck supple. No tenderness.  Lymphadenopathy:     Cervical: No cervical adenopathy.  Skin:    General: Skin is warm and dry.     Comments: Bilateral feet: Erythematous +1 pitting/nonpitting pedal edema noted  Neurological:     General: No focal deficit present.     Mental Status: He is alert and oriented to person, place, and time.  Psychiatric:        Mood and Affect: Mood normal.        Behavior: Behavior normal.     UC Treatments / Results  Labs (all labs ordered are listed, but only abnormal results are displayed) Labs Reviewed - No data to display  EKG   Radiology No results found.  Procedures Procedures (including critical care time)  Medications Ordered in UC Medications - No data to display  Initial Impression / Assessment and Plan / UC Course  I have reviewed the triage vital signs and the nursing notes.  Pertinent labs & imaging results that were available during my care of the patient were reviewed by me and considered in my medical decision making (see chart for details).     MDM: 1.  Bilateral foot swelling-Rx'd Furosemide 40 mg daily x3 days. Advised patient to take medication as directed starting tomorrow, Saturday, 03/26/2021-Monday, 03/28/2021.  Advised/instructed patient if bilateral foot swelling/redness/pain worsens please go to nearest ED for further evaluation.  Patient discharged home, hemodynamically stable. Final Clinical Impressions(s) / UC Diagnoses   Final diagnoses:  Foot swelling     Discharge Instructions      Advised patient to take medication as directed starting tomorrow, Saturday, 03/26/2021-Monday, 03/28/2021.  Advised/instructed patient if bilateral foot swelling/redness worsens to please go to  nearest ED for further evaluation.     ED Prescriptions     Medication Sig Dispense Auth. Provider   furosemide (LASIX) 40 MG tablet Take 1 tablet p.o. daily x 3 days. 3 tablet Eliezer Lofts, FNP      PDMP not reviewed this encounter.   Eliezer Lofts, [place] 03/25/21 1910

## 2021-08-04 ENCOUNTER — Telehealth: Payer: Self-pay | Admitting: Family Medicine

## 2021-08-04 NOTE — Telephone Encounter (Signed)
Pt call and stated he is returning your call and want a call back. 

## 2021-08-04 NOTE — Telephone Encounter (Signed)
Spoke with the patient as PCP received office notes from Dr Angelina Ok from [facility] and she stated the patient has not been seen in over 2.5 years and would need a visit.  Appt scheduled for 08/19/2021 as patient requested a virtual visit only and office notes were sent to be scanned.

## 2021-08-19 ENCOUNTER — Encounter: Payer: Self-pay | Admitting: Family Medicine

## 2021-08-19 ENCOUNTER — Telehealth (INDEPENDENT_AMBULATORY_CARE_PROVIDER_SITE_OTHER): Payer: Medicare Other | Admitting: Family Medicine

## 2021-08-19 ENCOUNTER — Other Ambulatory Visit: Payer: Self-pay

## 2021-08-19 DIAGNOSIS — G479 Sleep disorder, unspecified: Secondary | ICD-10-CM | POA: Diagnosis not present

## 2021-08-19 DIAGNOSIS — Q8789 Other specified congenital malformation syndromes, not elsewhere classified: Secondary | ICD-10-CM | POA: Diagnosis not present

## 2021-08-19 DIAGNOSIS — R002 Palpitations: Secondary | ICD-10-CM

## 2021-08-19 DIAGNOSIS — Z131 Encounter for screening for diabetes mellitus: Secondary | ICD-10-CM

## 2021-08-19 DIAGNOSIS — R5383 Other fatigue: Secondary | ICD-10-CM

## 2021-08-19 DIAGNOSIS — G629 Polyneuropathy, unspecified: Secondary | ICD-10-CM

## 2021-08-19 DIAGNOSIS — Z13220 Encounter for screening for lipoid disorders: Secondary | ICD-10-CM

## 2021-08-19 DIAGNOSIS — N529 Male erectile dysfunction, unspecified: Secondary | ICD-10-CM

## 2021-08-19 DIAGNOSIS — D481 Neoplasm of uncertain behavior of connective and other soft tissue: Secondary | ICD-10-CM

## 2021-08-19 DIAGNOSIS — F411 Generalized anxiety disorder: Secondary | ICD-10-CM | POA: Diagnosis not present

## 2021-08-19 DIAGNOSIS — E538 Deficiency of other specified B group vitamins: Secondary | ICD-10-CM

## 2021-08-19 NOTE — Progress Notes (Signed)
Virtual Visit via Video Note  I connected with Jonathan Marsh  on 08/19/21 at 11:30 AM EST by a video enabled telemedicine application and verified that I am speaking with the correct person using two identifiers.  Location patient: home Location provider: [place], [place] [id number] Persons participating in the virtual visit: patient, provider  I discussed the limitations of evaluation and management by telemedicine and the availability of in person appointments. The patient expressed understanding and agreed to proceed.   Jonathan Marsh DOB: Dec 03, 1985 Encounter date: 08/19/2021  This is a 36 y.o. male who presents with Chief Complaint  Patient presents with   Follow-up    History of present illness:  His last CT scan showed that tumor that was size of football has broken down. Other 2 tumors on left side have shrunk to quarter size. Bad part is that they are afraid he has permanent nerve damage in abd from tumor doubling in size and it caused a lot of problems atthat size when enlarged for a year without shrinking. He did MRI for lower/middle back to see if he was eligible for spinal stimulator insertion. In feb he has pain psych eval. Then can go in to do trial and then will do implant. Typically indicated for back pain, but there has been some success with abd pain. Hasn't been able to work with the present level of pain. Pain right side abd. Used to be just lower, but now it is higher and goes all down right side and middle lower abdomen. Very sharp. Feels like - feels like there is hand tugging/pulling on things. The more active he is,the worse it gets. Tight jeans, bels, activity like bending/twisting makes it worse. On average pain level is 5-5.5. good days at 4/10; bad days 8-8.5. pain intensity lingers after increased activity.   Went through "dark space" with mood for awhile. Was depressed. Hard managing with chronic pain. Currently in therapy. Has  psychiatrist but has hard time getting in with her. Mood does feel like it is getting better now. Still working to get back into things that he used to enjoy doing. Does have desire now to get back to doing more.   Follows with Dr. Henrene Pastor - will have endoscopy this summer.   Energy level is better than it used to be. Still not great.   Sleep is all over the place.some nights no sleep; some 2-3 hours. Some nights sleeps for 10 hours. Not always feeling energetic after sleep. Has done a lot of different sleep meds - remeron, ambien, etc. Has tried multiple without benefit. Snores rarely. Slept ok as kid,but has had sleep difficulties for years. Felt like it was worse after getting off oral chemo.   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 Takes alpha lipoic acid for this which helps some. Heart hasn't felt the same since last round of chemo. When getting up and walking around he will get elevation in heart rate to 155. Can make him light headed, sweaty. This is just walking around. More commonly up to 110-120's. Hasn't had evaluation for this. Was told by oncology that heart was weaker from chemo.   Had some Pulmonary emoblisms and was put on elliquis. Starting having syncopal episodes. Passed out and broke his leg.finally realized that it was side effect of elliquis. When he stopped taking the eliquis while on chemo and not feeling well episodes got better and finally realized connection.  Allergies  Allergen Reactions  Ondansetron Hcl Other (See Comments)    Headache   Current Meds  Medication Sig   albuterol (VENTOLIN HFA) 108 (90 Base) MCG/ACT inhaler Inhale 1-2 puffs into the lungs every 6 (six) hours as needed for wheezing or shortness of breath.   ALPRAZolam (XANAX) 0.5 MG tablet Take 0.5 mg by mouth at bedtime as needed for anxiety.   busPIRone (BUSPAR) 15 MG tablet Take 15 mg by mouth 2 (two) times daily.   diltiazem 2 % GEL Apply to the rectum five times a day   mirtazapine (REMERON) 15 MG  tablet Take 15 mg by mouth at bedtime.   oxycodone (ROXICODONE) 30 MG immediate release tablet Take 30 mg by mouth every 3 (three) hours.   PARoxetine (PAXIL) 40 MG tablet Take 40 mg by mouth every morning.   pregabalin (LYRICA) 200 MG capsule Take 200 mg by mouth 3 (three) times daily.   prochlorperazine (COMPAZINE) 25 MG suppository Place 25 mg rectally every 12 (twelve) hours as needed for nausea or vomiting.   sildenafil (VIAGRA) 100 MG tablet Take 1 tablet (100 mg total) by mouth as needed for erectile dysfunction.   XTAMPZA ER 13.5 MG C12A Take by mouth in the morning, at noon, and at bedtime.   XTAMPZA ER 36 MG C12A Take 2 capsules by mouth every 8 (eight) hours.    Review of Systems  Constitutional:  Negative for chills, fatigue and fever.  Respiratory:  Negative for cough, chest tightness, shortness of breath and wheezing.   Cardiovascular:  Negative for chest pain, palpitations and leg swelling.  Gastrointestinal:  Positive for abdominal pain (chronic).  Psychiatric/Behavioral:         Mood is stable, better   Objective:  There were no vitals taken for this visit.      BP Readings from Last 3 Encounters:  03/25/21 96/62  06/07/20 130/80  06/01/20 111/75   Wt Readings from Last 3 Encounters:  03/25/21 245 lb (111.1 kg)  06/07/20 253 lb (114.8 kg)  06/01/20 250 lb (113.4 kg)    EXAM:  GENERAL: alert, oriented, appears well and in no acute distress  HEENT: atraumatic, conjunctiva clear, no obvious abnormalities on inspection of external nose and ears  NECK: normal movements of the head and neck  LUNGS: on inspection no signs of respiratory distress, breathing rate appears normal, no obvious gross SOB, gasping or wheezing  CV: no obvious cyanosis  MS: moves all visible extremities without noticeable abnormality  PSYCH/NEURO: pleasant and cooperative, no obvious depression or anxiety, speech and thought processing grossly intact   Assessment/Plan  1. Gardner's  syndrome He has follow up with GI this summer for repeat colonoscopy.   2. GAD (generalized anxiety disorder) Mood has been more stable. He is following with therapist regularly. Doing well with current meds.   3. Desmoid tumor of abdomen He follows regularly with specialist. Currently working on pain control s/p treatment.   4. Sleeping difficulty - Ambulatory referral to Sleep Studies  5. Palpitations - CBC with Differential/Platelet; Future - Comprehensive metabolic panel; Future - TSH; Future - Cardiac event monitor; Future  6. Lipid screening - Lipid panel; Future  7. Screening for diabetes mellitus Bloodwork today.  8. Other fatigue - Vitamin B12; Future - Folate; Future - Homocysteine; Future - Methylmalonic acid, serum; Future  9. Neuropathy - Vitamin B12; Future - Folate; Future  10. Erectile dysfunction, unspecified erectile dysfunction type - Testosterone; Future  11. B12 deficiency - Vitamin B12; Future - Folate; Future -  Homocysteine; Future - Methylmalonic acid, serum; Future   Return for pending bloodwork results.    I discussed the assessment and treatment plan with the patient. The patient was provided an opportunity to ask questions and all were answered. The patient agreed with the plan and demonstrated an understanding of the instructions.   The patient was advised to call back or seek an in-person evaluation if the symptoms worsen or if the condition fails to improve as anticipated.  I provided 30 minutes of face-to-face time during this encounter.   Micheline Rough, MD

## 2021-08-29 ENCOUNTER — Encounter: Payer: Self-pay | Admitting: Family Medicine

## 2021-09-05 ENCOUNTER — Ambulatory Visit (INDEPENDENT_AMBULATORY_CARE_PROVIDER_SITE_OTHER): Payer: Medicare Other

## 2021-09-05 ENCOUNTER — Encounter: Payer: Self-pay | Admitting: Family Medicine

## 2021-09-05 DIAGNOSIS — R9431 Abnormal electrocardiogram [ECG] [EKG]: Secondary | ICD-10-CM

## 2021-09-05 DIAGNOSIS — R002 Palpitations: Secondary | ICD-10-CM | POA: Diagnosis not present

## 2021-09-26 ENCOUNTER — Telehealth: Payer: Self-pay | Admitting: *Deleted

## 2021-09-26 NOTE — Telephone Encounter (Signed)
° °  Cardiac Monitor Alert  Date of alert:  09/26/2021   Patient Name: Jonathan Marsh  DOB: 07-15-1986  MRN: [id number]   [facility] Cardiologist: None  [facility] EP:  None    Monitor Information: Cardiac Event Monitor [[employer]]  Reason:  Palpitations  Ordering provider:  Micheline Rough (PCP)   Alert Sinus Rhythm - Pt trigger  This is the 1st alert for this rhythm.   The patient was contacted today.  He is symptomatic.  He reports the following symptoms:  Patient got lightheaded, dizzy, vision faded and went away for a second but did not fully pass out. Patient was watching TV and got up for about 20 seconds walking in living room then symptoms started and he sat down.  Arrhythmia, symptoms and history reviewed with Dr. Caryl Comes (DOD).   Plan: Strips read NSR per MD. Continue to monitor.    Other: Patient would like to note that he has a sedentary lifestyle other than house duty's such as laundry or cleaning the floor r/t tumor pain.   Darrell Jewel, RN  09/26/2021 4:18 PM

## 2021-09-28 NOTE — Telephone Encounter (Signed)
Noted  

## 2021-10-26 ENCOUNTER — Other Ambulatory Visit (INDEPENDENT_AMBULATORY_CARE_PROVIDER_SITE_OTHER): Payer: Medicare Other

## 2021-10-26 DIAGNOSIS — E538 Deficiency of other specified B group vitamins: Secondary | ICD-10-CM | POA: Diagnosis not present

## 2021-10-26 DIAGNOSIS — R5383 Other fatigue: Secondary | ICD-10-CM

## 2021-10-26 DIAGNOSIS — Z13220 Encounter for screening for lipoid disorders: Secondary | ICD-10-CM | POA: Diagnosis not present

## 2021-10-26 DIAGNOSIS — N529 Male erectile dysfunction, unspecified: Secondary | ICD-10-CM

## 2021-10-26 DIAGNOSIS — G629 Polyneuropathy, unspecified: Secondary | ICD-10-CM

## 2021-10-26 DIAGNOSIS — R002 Palpitations: Secondary | ICD-10-CM

## 2021-10-26 DIAGNOSIS — R7989 Other specified abnormal findings of blood chemistry: Secondary | ICD-10-CM

## 2021-10-26 LAB — TSH: TSH: 2.82 u[IU]/mL (ref 0.35–5.50)

## 2021-10-26 LAB — COMPREHENSIVE METABOLIC PANEL
ALT: 21 U/L (ref 0–53)
AST: 19 U/L (ref 0–37)
Albumin: 4.6 g/dL (ref 3.5–5.2)
Alkaline Phosphatase: 81 U/L (ref 39–117)
BUN: 12 mg/dL (ref 6–23)
CO2: 27 mEq/L (ref 19–32)
Calcium: 10 mg/dL (ref 8.4–10.5)
Chloride: 103 mEq/L (ref 96–112)
Creatinine, Ser: 1.06 mg/dL (ref 0.40–1.50)
GFR: 90.32 mL/min (ref >60.00)
Glucose, Bld: 92 mg/dL (ref 70–99)
Potassium: 3.7 mEq/L (ref 3.5–5.1)
Sodium: 140 mEq/L (ref 135–145)
Total Bilirubin: 1.6 mg/dL — ABNORMAL HIGH (ref 0.2–1.2)
Total Protein: 7.1 g/dL (ref 6.0–8.3)

## 2021-10-26 LAB — LIPID PANEL
Cholesterol: 181 mg/dL (ref 0–200)
HDL: 41.8 mg/dL (ref >39.00)
LDL Cholesterol: 100 mg/dL — ABNORMAL HIGH (ref 0–99)
NonHDL: 138.93
Total CHOL/HDL Ratio: 4
Triglycerides: 195 mg/dL — ABNORMAL HIGH (ref 0.0–149.0)
VLDL: 39 mg/dL (ref 0.0–40.0)

## 2021-10-26 LAB — CBC WITH DIFFERENTIAL/PLATELET
Basophils Absolute: 0 10*3/uL (ref 0.0–0.1)
Basophils Relative: 0.7 % (ref 0.0–3.0)
Eosinophils Absolute: 0.3 10*3/uL (ref 0.0–0.7)
Eosinophils Relative: 4.4 % (ref 0.0–5.0)
HCT: 43.5 % (ref 39.0–52.0)
Hemoglobin: 14.9 g/dL (ref 13.0–17.0)
Lymphocytes Relative: 23.5 % (ref 12.0–46.0)
Lymphs Abs: 1.7 10*3/uL (ref 0.7–4.0)
MCHC: 34.3 g/dL (ref 30.0–36.0)
MCV: 90.7 fl (ref 78.0–100.0)
Monocytes Absolute: 0.5 10*3/uL (ref 0.1–1.0)
Monocytes Relative: 7.8 % (ref 3.0–12.0)
Neutro Abs: 4.5 10*3/uL (ref 1.4–7.7)
Neutrophils Relative %: 63.6 % (ref 43.0–77.0)
Platelets: 310 10*3/uL (ref 150.0–400.0)
RBC: 4.79 Mil/uL (ref 4.22–5.81)
RDW: 14.2 % (ref 11.5–15.5)
WBC: 7.1 10*3/uL (ref 4.0–10.5)

## 2021-10-26 LAB — FOLATE: Folate: 19.1 ng/mL (ref >5.9)

## 2021-10-26 LAB — TESTOSTERONE: Testosterone: 285.12 ng/dL — ABNORMAL LOW (ref 300.00–890.00)

## 2021-10-26 LAB — VITAMIN B12: Vitamin B-12: 187 pg/mL — ABNORMAL LOW (ref 211–911)

## 2021-10-30 LAB — METHYLMALONIC ACID, SERUM: Methylmalonic Acid, Quant: 112 nmol/L (ref 87–318)

## 2021-10-30 LAB — HOMOCYSTEINE: Homocysteine: 11.3 umol/L (ref <11.4)

## 2021-11-01 ENCOUNTER — Ambulatory Visit (INDEPENDENT_AMBULATORY_CARE_PROVIDER_SITE_OTHER): Payer: Medicare Other | Admitting: Neurology

## 2021-11-01 ENCOUNTER — Encounter: Payer: Self-pay | Admitting: Neurology

## 2021-11-01 VITALS — BP 108/71 | HR 75 | Ht 78.0 in | Wt 257.0 lb

## 2021-11-01 DIAGNOSIS — G478 Other sleep disorders: Secondary | ICD-10-CM | POA: Diagnosis not present

## 2021-11-01 DIAGNOSIS — R0683 Snoring: Secondary | ICD-10-CM | POA: Diagnosis not present

## 2021-11-01 DIAGNOSIS — G4700 Insomnia, unspecified: Secondary | ICD-10-CM

## 2021-11-01 DIAGNOSIS — Z79891 Long term (current) use of opiate analgesic: Secondary | ICD-10-CM

## 2021-11-01 DIAGNOSIS — E663 Overweight: Secondary | ICD-10-CM

## 2021-11-01 DIAGNOSIS — G4719 Other hypersomnia: Secondary | ICD-10-CM

## 2021-11-01 NOTE — Patient Instructions (Signed)

## 2021-11-01 NOTE — Progress Notes (Signed)
Subjective:  ?  ?Patient ID: Jonathan Marsh is a 36 y.o. male. ? ?HPI ? ? ? ?Jonathan Age, MD, PhD ?[facility] ?[address], [address] ?[phone] ?[place], [place] [address] ? ?Dear Jonathan Marsh,  ? ?I saw your patient, Jonathan Marsh, upon your kind request in the sleep clinic today for initial consultation of his sleep disorder, in particular, concern for obstructive sleep apnea.  The patient is unaccompanied today.  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  He does not wake up rested and has daytime somnolence.  His symptoms have become worse over time, started when he had his first abdominal tumor in 2018 with ongoing issues with chronic pain disrupting his sleep.  After his colectomy as a child, he has had issues with chronic diarrhea, has to get up at night at least 2 or 3 times to go to the bathroom, typically no nocturia.  He denies recurrent morning headaches.  He is not aware of any family history of sleep apnea.  He has a difficult time maintaining a sleep schedule, generally tries to be in bed between 10 PM and 11:30 PM and rise time is generally between 6 and 9 AM.  He does not currently work.  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  Epworth sleepiness score is 13 out of 24, fatigue severity score is 33 out of 63.  He was previously followed by a psychiatrist for his anxiety and mood disorder, he is in the process of finding a new psychiatrist.  He is no longer on BuSpar or Paxil.  He weaned off of these medications.  He is currently on methadone and oxycodone as well as high-dose Lyrica, prescriptions are through his palliative care  specialist through [facility].  He quit smoking, was an intermittent smoker for a few years, does not drink any alcohol, does not currently drink caffeine on a day-to-day basis but previously had quite significant caffeine use with multiple energy drinks per day.  He denies any telltale symptoms of restless leg syndrome.  He lives with his wife, he has 2 children from his previous marriage, kids are in high school and lives with her mom.  His wife has 2 adult children who live on their own.   ?I reviewed your virtual visit note from 08/19/2021.  He has been on Remeron, currently at 30 mg at bedtime. ? ?His Past Medical History Is Significant For: ?Past Medical History:  ?Diagnosis Date  ? Ampullary adenoma   ? Desmoid tumor of abdomen   ? Gardner syndrome   ? Gastric polyp   ? GERD (gastroesophageal reflux disease)   ? Headache   ? mild - stress related  ? Multiple gastric polyps   ? Polyp of duodenum   ? ? ?His Past Surgical History Is Significant For: ?Past Surgical History:  ?Procedure Laterality Date  ? APPENDECTOMY    ? COLECTOMY    ? Marsh 36 due to gardners syndrome- has j pouch reconstruction  ? COLONOSCOPY    ? ESOPHAGOGASTRODUODENOSCOPY N/A 03/30/2015  ? Procedure: ESOPHAGOGASTRODUODENOSCOPY (EGD);  Surgeon: Jonathan Shipper, MD;  Location: [facility];  Service: Endoscopy;  Laterality: N/A;  need side viewing scope  ?  ESOPHAGOGASTRODUODENOSCOPY N/A 01/18/2016  ? Procedure: ESOPHAGOGASTRODUODENOSCOPY (EGD);  Surgeon: Jonathan Shipper, MD;  Location: [facility];  Service: Endoscopy;  Laterality: N/A;   use ercp scope   ? ESOPHAGOGASTRODUODENOSCOPY (EGD) WITH PROPOFOL N/A 07/19/2018  ? Procedure: ESOPHAGOGASTRODUODENOSCOPY (EGD) WITH PROPOFOL;  Surgeon: Jonathan Shipper, MD;  Location: [facility];  Service: Endoscopy;  Laterality: N/A;  ERCP SCOPE  ? UPPER GASTROINTESTINAL ENDOSCOPY    ? ? ?His Family History Is Significant For: ?Family History  ?Problem Relation Marsh of Onset  ? Cancer Mother   ?     GI cancer  ? Drug abuse  Mother   ? Colon polyps Mother   ? Other Mother   ?     Gardners syndrome  ? Familial polyposis Mother   ? Lung cancer Maternal Grandmother   ? Colon cancer Maternal Grandfather   ? Colon polyps Maternal Grandfather   ? Other Maternal Grandfather   ?     Gardners syndrome  ? Brain cancer Paternal Grandmother   ? Liver cancer Paternal Grandmother   ? Sleep apnea Neg Hx   ? ? ?His Social History Is Significant For: ?Social History  ? ?Socioeconomic History  ? Marital status: Married  ?  Spouse name: Not on file  ? Number of children: Not on file  ? Years of education: Not on file  ? Highest education level: Not on file  ?Occupational History  ? Not on file  ?Tobacco Use  ? Smoking status: Former  ?  Types: Cigarettes  ?  Quit date: 01/01/2008  ?  Years since quitting: 13.8  ? Smokeless tobacco: Never  ?Vaping Use  ? Vaping Use: Never used  ?Substance and Sexual Activity  ? Alcohol use: No  ?  Alcohol/week: 0.0 standard drinks  ? Drug use: No  ? Sexual activity: Not on file  ?Other Topics Concern  ? Not on file  ?Social History Narrative  ? 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 Last day of work was 11/16/16.   ?   ? Home Situation: lives with wife  ?   ? Spiritual Beliefs: none  ?   ? Lifestyle: no regular exercise; diet is bad  ?   ? ?Social Determinants of Health  ? ?Financial Resource Strain: Not on file  ?Food Insecurity: Not on file  ?Transportation Needs: Not on file  ?Physical Activity: Not on file  ?Stress: Not on file  ?Social Connections: Not on file  ? ? ?His Allergies Are:  ?Allergies  ?Allergen Reactions  ? Ondansetron Hcl Other (See Comments)  ?  Severe headache  ?:  ? ?His Current Medications Are:  ?Outpatient Encounter Medications as of 11/01/2021  ?Medication Sig  ? albuterol (VENTOLIN HFA) 108 (90 Base) MCG/ACT inhaler Inhale 1-2 puffs into the lungs every 6 (six) hours as needed for wheezing or shortness of breath.  ? ALPRAZolam (XANAX) 0.5 MG tablet Take  0.5 mg by mouth at bedtime as needed for anxiety.  ? methadone (DOLOPHINE) 10 MG tablet Take 10 mg by mouth 4 (four) times daily.  ? mirtazapine (REMERON) 30 MG tablet Take 30 mg by mouth at bedtime.  ? oxycodone (ROXICODONE) 30 MG immediate release tablet Take 30 mg by mouth every 3 (three) hours.  ? pregabalin (LYRICA) 200 MG capsule Take 200 mg by mouth 3 (three) times daily.  ? prochlorperazine (COMPAZINE) 25 MG suppository Place 25 mg rectally every 12 (twelve) hours as  needed for nausea or vomiting.  ? sildenafil (VIAGRA) 100 MG tablet Take 1 tablet (100 mg total) by mouth as needed for erectile dysfunction.  ? busPIRone (BUSPAR) 15 MG tablet Take 15 mg by mouth 2 (two) times daily. (Patient not taking: Reported on 11/01/2021)  ? diltiazem 2 % GEL Apply to the rectum five times a day (Patient not taking: Reported on 11/01/2021)  ? PARoxetine (PAXIL) 40 MG tablet Take 40 mg by mouth every morning. (Patient not taking: Reported on 11/01/2021)  ? XTAMPZA ER 13.5 MG C12A Take by mouth in the morning, at noon, and at bedtime. (Patient not taking: Reported on 11/01/2021)  ? XTAMPZA ER 36 MG C12A Take 2 capsules by mouth every 8 (eight) hours. (Patient not taking: Reported on 11/01/2021)  ? [DISCONTINUED] mirtazapine (REMERON) 15 MG tablet Take 15 mg by mouth at bedtime.  ? ?No facility-administered encounter medications on file as of 11/01/2021.  ?: ? ? ?Review of Systems:  ?Out of a complete 14 point review of systems, all are reviewed and negative with the exception of these symptoms as listed below: ? ?Review of Systems  ?Neurological:   ?     Patient is here for a sleep consult. He states he has trouble sleeping, both falling asleep and staying asleep. He states in 2018 he was diagnosed with a football-sized Desmoid tumor in his abdomen. Since then he has been on chemotherapy. The tumor has grown and caused intense pain. He has three tumors now. Increase in activity during the day can elevate his pain. He is anxious  and in pain at night and that affects his sleep. He takes Mirtazapine 30 mg at night. ESS 13, FSS 33.  ? ?Objective:  ?Neurological Exam ? ?Physical Exam ?Physical Examination:  ? ?Vitals:  ? 11/01/21 1547

## 2021-11-03 ENCOUNTER — Telehealth: Payer: Self-pay

## 2021-11-03 NOTE — Telephone Encounter (Signed)
LVM for pt to call me back to schedule sleep study  

## 2021-11-07 ENCOUNTER — Ambulatory Visit (INDEPENDENT_AMBULATORY_CARE_PROVIDER_SITE_OTHER): Payer: Medicare Other | Admitting: Interventional Cardiology

## 2021-11-07 ENCOUNTER — Encounter: Payer: Self-pay | Admitting: Interventional Cardiology

## 2021-11-07 ENCOUNTER — Other Ambulatory Visit: Payer: Self-pay

## 2021-11-07 VITALS — BP 90/62 | HR 81 | Ht 77.5 in | Wt 256.0 lb

## 2021-11-07 DIAGNOSIS — I471 Supraventricular tachycardia: Secondary | ICD-10-CM

## 2021-11-07 DIAGNOSIS — R931 Abnormal findings on diagnostic imaging of heart and coronary circulation: Secondary | ICD-10-CM | POA: Diagnosis not present

## 2021-11-07 DIAGNOSIS — R55 Syncope and collapse: Secondary | ICD-10-CM

## 2021-11-07 MED ORDER — METOPROLOL TARTRATE 25 MG PO TABS: 12.5000 mg | ORAL_TABLET | Freq: Two times a day (BID) | ORAL | 2 refills | Status: DC | PRN

## 2021-11-07 NOTE — Patient Instructions (Signed)
Medication Instructions:  ?Your physician recommends that you continue on your current medications as directed. Please refer to the Current Medication list given to you today. ?May take metoprolol tartrate 12.5 mg by mouth twice daily as needed for increased heart rate/palpitations  ?*If you need a refill on your cardiac medications before your next appointment, please call your pharmacy* ? ? ?Lab Work: ?none ?If you have labs (blood work) drawn today and your tests are completely normal, you will receive your results only by: ?MyChart Message (if you have MyChart) OR ?A paper copy in the mail ?If you have any lab test that is abnormal or we need to change your treatment, we will call you to review the results. ? ? ?Testing/Procedures: ?Your physician has requested that you have an echocardiogram. Echocardiography is a painless test that uses sound waves to create images of your heart. It provides your doctor with information about the size and shape of your heart and how well your heart?s chambers and valves are working. This procedure takes approximately one hour. There are no restrictions for this procedure. ? ? ? ?Follow-Up: ?At [facility], you and your health needs are our priority.  As part of our continuing mission to provide you with exceptional heart care, we have created designated Provider Care Teams.  These Care Teams include your primary Cardiologist (physician) and Advanced Practice Providers (APPs -  Physician Assistants and Nurse Practitioners) who all work together to provide you with the care you need, when you need it. ? ?We recommend signing up for the patient portal called "MyChart".  Sign up information is provided on this After Visit Summary.  MyChart is used to connect with patients for Virtual Visits (Telemedicine).  Patients are able to view lab/test results, encounter notes, upcoming appointments, etc.  Non-urgent messages can be sent to your provider as well.   ?To learn more about  what you can do with MyChart, go to [email or web address].   ? ?Your next appointment:   ?Based on echo and EP results ? ?The format for your next appointment:   ?In Person ? ?Provider:   ?Larae Grooms, MD   ? ? ?Other Instructions ?You have been referred to EP--Dr Quentin Ore or Dr Curt Bears ? ? ?

## 2021-11-07 NOTE — Progress Notes (Signed)
?  ?Cardiology Office Note ? ? ?Date:  11/07/2021  ? ?ID:  Jonathan Marsh, DOB February 24, 1986, MRN [id number] ? ?PCP:  Caren Macadam, MD  ? ? ?Chief Complaint  ?Patient presents with  ? Establish Care  ? ? ? ?Wt Readings from Last 3 Encounters:  ?11/07/21 256 lb (116.1 kg)  ?11/01/21 257 lb (116.6 kg)  ?03/25/21 245 lb (111.1 kg)  ?  ? ?  ?History of Present Illness: ?Jonathan Marsh is a 36 y.o. male who is being seen today for the evaluation of syncope at the request of Caren Macadam, MD.  ? ?He has a h/o Gardners syndrome, FAP.  He had colectomy at age 33. ? ?In 11/2016, he had a desmoid tumor in his abdomen the size foa football.  He was treated with chemotherapy.  Tumor size increased. Eventually, it shrunk- now almost gone. ? ?In March 2022. Patient had a syncopal event.  He was evaluated at [facility].  He was aving orthostatic sx at that time.  He passed out a few times and broke his leg. He had a limited echocardiogram in March 2022 showing LVEF 50%,Trivial MR, trivial TR and no aortic stenosis.  Grade 1 diastolic dysfunction. ? ?It was noted that the LVEF has dropped a little: ?"MILD LV DYSFUNCTION (See above) ? NORMAL LA PRESSURES WITH DIASTOLIC DYSFUNCTION ? NORMAL RIGHT VENTRICULAR SYSTOLIC FUNCTION ? VALVULAR REGURGITATION: TRIVIAL MR, TRIVIAL PR, TRIVIAL TR ? NO VALVULAR STENOSIS ? THE LVEF IS MEASURED AT 45-50%. ? ? 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  Eliquis was stopped and passing out stopped. Still had some dizziness and a few syncopal spells but things improved.  No anemia. Anticoagulation was stopped completely after 6 months. ? ?In January 2023, event monitor was .  He had an episode where he got: "lightheaded, dizzy, vision faded and went away for a second but did not fully pass out. Patient was watching TV and got up for about 20 seconds walking in living room then symptoms started and he sat down.  ?Arrhythmia,  symptoms and history reviewed with Dr. Caryl Comes ([facility]).   ?Plan: Strips read NSR per MD. Continue to monitor. " ? ?He noted significant tachycardia with standing on a regular basis.  ? ?Final reading of monitor showed: ?"Sinus rhythm including NSR, sinus tachycardia, sinus bradycardia ?Frequent episodes of SVT - HR as high as 150s-160" ? ? ?Still on pain meds.  Looking in to spinal cord stimulator. Hoping to wean off pain meds long term. ? ?.jvc ? ? ?Past Medical History:  ?Diagnosis Date  ? Ampullary adenoma   ? Desmoid tumor of abdomen   ? Gardner syndrome   ? Gastric polyp   ? GERD (gastroesophageal reflux disease)   ? Headache   ? mild - stress related  ? Multiple gastric polyps   ? Polyp of duodenum   ? ? ?Past Surgical History:  ?Procedure Laterality Date  ? APPENDECTOMY    ? COLECTOMY    ? age 10 due to gardners syndrome- has j pouch reconstruction  ? COLONOSCOPY    ? ESOPHAGOGASTRODUODENOSCOPY N/A 03/30/2015  ? Procedure: ESOPHAGOGASTRODUODENOSCOPY (EGD);  Surgeon: Irene Shipper, MD;  Location: [facility];  Service: Endoscopy;  Laterality: N/A;  need side viewing scope  ? ESOPHAGOGASTRODUODENOSCOPY N/A 01/18/2016  ? Procedure: ESOPHAGOGASTRODUODENOSCOPY (EGD);  Surgeon: Irene Shipper, MD;  Location: [facility];  Service: Endoscopy;  Laterality: N/A;  use ercp scope   ? ESOPHAGOGASTRODUODENOSCOPY (EGD) WITH PROPOFOL N/A 07/19/2018  ? Procedure: ESOPHAGOGASTRODUODENOSCOPY (EGD) WITH PROPOFOL;  Surgeon: Irene Shipper, MD;  Location: [facility];  Service: Endoscopy;  Laterality: N/A;  ERCP SCOPE  ? UPPER GASTROINTESTINAL ENDOSCOPY    ? ? ? ?Current Outpatient Medications  ?Medication Sig Dispense Refill  ? albuterol (VENTOLIN HFA) 108 (90 Base) MCG/ACT inhaler Inhale 1-2 puffs into the lungs every 6 (six) hours as needed for wheezing or shortness of breath. 8 g 0  ? Alpha-Lipoic Acid 600 MG CAPS Take 1 capsule by mouth in the morning and at bedtime.    ? ALPRAZolam (XANAX) 0.5 MG tablet Take 0.5 mg by mouth at  bedtime as needed for anxiety.    ? Melatonin 10 MG CAPS Take 1 tablet by mouth at bedtime as needed.    ? methadone (DOLOPHINE) 10 MG tablet Take 10 mg by mouth 4 (four) times daily.    ? mirtazapine (REMERON) 30 MG tablet Take 30 mg by mouth at bedtime.    ? oxycodone (ROXICODONE) 30 MG immediate release tablet Take 30 mg by mouth every 3 (three) hours.    ? pregabalin (LYRICA) 200 MG capsule Take 200 mg by mouth 3 (three) times daily.    ? prochlorperazine (COMPAZINE) 25 MG suppository Place 25 mg rectally every 12 (twelve) hours as needed for nausea or vomiting.    ? sildenafil (VIAGRA) 100 MG tablet Take 1 tablet (100 mg total) by mouth as needed for erectile dysfunction. 10 tablet 0  ? ?No current facility-administered medications for this visit.  ? ? ?Allergies:   Ondansetron and Ondansetron hcl  ? ? ?Social History:  The patient  reports that he quit smoking about 13 years ago. His smoking use included cigarettes. He has never been exposed to tobacco smoke. He has never used smokeless tobacco. He reports that he does not drink alcohol and does not use drugs.  ? ?Family History:  The patient's family history includes Brain cancer in his paternal grandmother; Cancer in his mother; Colon cancer in his maternal grandfather; Colon polyps in his maternal grandfather and mother; Drug abuse in his mother; Familial polyposis in his mother; Liver cancer in his paternal grandmother; Lung cancer in his maternal grandmother; Other in his maternal grandfather and mother.  ? ? ?ROS:  Please see the history of present illness.   Otherwise, review of systems are positive for palpitations.   All other systems are reviewed and negative.  ? ? ?PHYSICAL EXAM: ?VS:  BP 90/62   Pulse 81   Ht 6' 5.5" (1.969 m)   Wt 256 lb (116.1 kg)   SpO2 98%   BMI 29.97 kg/m?  , BMI Body mass index is 29.97 kg/m?. ?GEN: Well nourished, well developed, in no acute distress ?HEENT: normal ?Neck: no JVD, carotid bruits, or masses ?Cardiac: RRR;  no murmurs, rubs, or gallops,no edema  ?Respiratory:  clear to auscultation bilaterally, normal work of breathing ?GI: soft, nontender, nondistended, + BS ?MS: no deformity or atrophy ?Skin: warm and dry, no rash ?Neuro:  Strength and sensation are intact ?Psych: euthymic mood, full affect ? ? ?EKG:   ?The ekg ordered today demonstrates normal ECG ? ? ?Recent Labs: ?10/26/2021: ALT 21; BUN 12; Creatinine, Ser 1.06; Hemoglobin 14.9; Platelets 310.0; Potassium 3.7; Sodium 140; TSH 2.82  ? ?Lipid Panel ?   ?Component Value Date/Time  ? CHOL 181 10/26/2021 0950  ? TRIG 195.0 (H) 10/26/2021 0950  ? HDL 41.80  10/26/2021 0950  ? CHOLHDL 4 10/26/2021 0950  ? VLDL 39.0 10/26/2021 0950  ? [place] 100 (H) 10/26/2021 0950  ? ?  ?Other studies Reviewed: ?Additional studies/ records that were reviewed today with results demonstrating: Records reviewed. ? ? ?ASSESSMENT AND PLAN: ? ?SVT: I personally reviewed the monitor strips.  He does have episodic supraventricular tachycardia with heart rates in the 150s to 160s.  He also has episodes of sinus tachycardia.  These episodes along with sinus tach correlate to his sx.  His symptoms of lightheadedness typically occurred when he was in sinus rhythm.  Stay well-hydrated.  Avoid excessive caffeine.  Blood pressure will limit medical therapy.  Refer to EP to discuss possible ablation.  Metoprolol 12.5 mg p.o. twice daily as needed for sustained palpitations. ?Overweight: Whole food, plant-based diet recommended.  Avoid excessive caffeine.  Occasional soda consumption.  ?Abnormal echocardiogram: I am unable to see the images.  His LVEF was described as slightly low.  Some home BP readings in the 90-108 range.  We will repeat echocardiogram. ?Syncope: Did not correlate with SVT, but he noted that he did not push the button every time he felt symptoms of tachycardia due to not wanting to push the button too often. ? ? ?Current medicines are reviewed at length with the patient today.  The  patient concerns regarding his medicines were addressed. ? ?The following changes have been made:  No change ? ?Labs/ tests ordered today include:  ?No orders of the defined types were placed in this encounter. ?

## 2021-11-08 ENCOUNTER — Telehealth: Payer: Self-pay

## 2021-11-08 NOTE — Telephone Encounter (Signed)
Returned call and LVM for pt to call me back to schedule sleep study ? ?

## 2021-11-15 ENCOUNTER — Ambulatory Visit (HOSPITAL_COMMUNITY): Payer: Medicare Other | Attending: Cardiology

## 2021-11-15 DIAGNOSIS — I471 Supraventricular tachycardia: Secondary | ICD-10-CM | POA: Diagnosis present

## 2021-11-15 LAB — ECHOCARDIOGRAM COMPLETE
Area-P 1/2: 4.15 cm2
S' Lateral: 3.6 cm

## 2021-11-15 MED ORDER — PERFLUTREN LIPID MICROSPHERE
1.0000 mL | INTRAVENOUS | Status: AC | PRN
  Administered 2021-11-15: 1 mL via INTRAVENOUS

## 2021-11-16 ENCOUNTER — Ambulatory Visit (INDEPENDENT_AMBULATORY_CARE_PROVIDER_SITE_OTHER): Payer: Medicare Other | Admitting: Family Medicine

## 2021-11-16 ENCOUNTER — Encounter: Payer: Self-pay | Admitting: Family Medicine

## 2021-11-16 VITALS — BP 96/58 | HR 79 | Temp 98.1°F | Ht 77.5 in | Wt 257.5 lb

## 2021-11-16 DIAGNOSIS — R7989 Other specified abnormal findings of blood chemistry: Secondary | ICD-10-CM

## 2021-11-16 DIAGNOSIS — L219 Seborrheic dermatitis, unspecified: Secondary | ICD-10-CM | POA: Diagnosis not present

## 2021-11-16 DIAGNOSIS — R17 Unspecified jaundice: Secondary | ICD-10-CM

## 2021-11-16 MED ORDER — KETOCONAZOLE 2 % EX SHAM: 1.0000 "application " | MEDICATED_SHAMPOO | CUTANEOUS | 5 refills | Status: DC

## 2021-11-16 MED ORDER — FLUCONAZOLE 150 MG PO TABS: ORAL_TABLET | ORAL | 0 refills | Status: DC

## 2021-11-16 MED ORDER — FLUOCINOLONE ACETONIDE 0.01 % EX SOLN: Freq: Two times a day (BID) | CUTANEOUS | 2 refills | Status: AC

## 2021-11-16 NOTE — Patient Instructions (Addendum)
Dr. Marcell Anger will be starting in August, you could also see Dr. Al Corpus are also accepting new patients as well as Dr. Elease Hashimoto ? ? ?*ketoconazole wash on head, beard daily x 2 weeks. Solution to scalp daily x 1 week, then every other day then as needed.  ? ?Let me know is scalp/skin is not better in 1 month. ?

## 2021-11-16 NOTE — Progress Notes (Signed)
?Jonathan Marsh ?DOB: May 18, 1986 ?Encounter date: 11/16/2021 ? ?This is a 36 y.o. male who presents with ?Chief Complaint  ?Patient presents with  ? Rash  ?  Patient complains of flaking, peeling of the skin noted on his scalp, around the nose, mustache and bumps noted on the forehead for years  ? patient complains of excessive sweating x3 years  ? ? 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 Was initially on scalp, but it went away when on chemo. If chemo was stopped, then rash would come back. Then went from scalp to around mustache. A few months ago came back after being off chemo. Now back in scalp, nose and goatee area. Gets large chunks of skin peeling off, sometimes bleeding, itching. Head and shoulders helped a little, but didn't clear it up. Tried for over 6 months. Still using this.  ? ?Excessive sweating started after first round of oral chemo. Has just gotten worse. Thought maybe related to heart rate. Any time standing up from sitting his heart rate will jump up to 150-160. On beta blocker now, but has consult with EP to discuss ablation. Not sure if this amplifies sweating. Will just pore sweat from armpits, body even when just sitting doing nothing. Knows that pain meds don't help this situation. Really tries to take less than prescribed.  ? ?Taking B12 1044mg for at least 1.5 weeks now.  ? ?Testosterone was low when checked last month.  ? 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 So not a lot of treatment options left. Getting CT/MRI q 6 months. Getting spinal cord stimulator trial on May 2nd. Praying that this will help with his pain. Pain is about 5/10 with medications; if he backs off meds or does physical activity then pain can shoot up to 7-8 for a couple of days. But definitely less than it was when tumor was huge.  ? ?Can't take as deep of breaths since having Pe's.  ? ?Any pushing, pulling, bending, twisting will  increase abdominal pain. When pain flares then will have difficulty for at least 1-2 days.  ? ?Has sleep study tomorrow with [facility].  ? ?Had fissure a little over a year ago. Stools aren't hard at baseline. Appetite was poor prior to previous bloodwork and then when he started eating more he had issues with aggravating fissure. Hasn't resolved now. Has appointment with GI on 5/4.  ? ?Is nauseated; vomits about twice a week. Feels increased nausea a couple of times/week.  ? ? ?Allergies  ?Allergen Reactions  ? Ondansetron Anaphylaxis and Rash  ?  Other reaction(s): Headache ?Other reaction(s): Other, Other (See Comments) ?Headache ?Other reaction(s): Headache,Migraine ?Other reaction(s): Headache,Migraine ?Other reaction(s): Headache,Migraine ?Headache ?Headache ?Other reaction(s): Headache,Migraine ?Headache  ? Ondansetron Hcl Other (See Comments)  ?  Severe headache  ? ?Current Meds  ?Medication Sig  ? albuterol (VENTOLIN HFA) 108 (90 Base) MCG/ACT inhaler Inhale 1-2 puffs into the lungs every 6 (six) hours as needed for wheezing or shortness of breath.  ? Alpha-Lipoic Acid 600 MG CAPS Take 1 capsule by mouth in the morning and at bedtime.  ? ALPRAZolam (XANAX) 0.5 MG tablet Take 0.5 mg by mouth at bedtime as needed for anxiety.  ? fluconazole (DIFLUCAN) 150 MG tablet Take 1 tablet PO x1, repeat in 3 days  ? fluocinolone (SYNALAR) 0.01 % external solution Apply topically 2 (two) times daily.  ? ketoconazole (NIZORAL) 2 % shampoo Apply  1 application. topically 2 (two) times a week.  ? Melatonin 10 MG CAPS Take 1 tablet by mouth at bedtime as needed.  ? methadone (DOLOPHINE) 10 MG tablet Take 10 mg by mouth 4 (four) times daily.  ? metoprolol tartrate (LOPRESSOR) 25 MG tablet Take 0.5 tablets (12.5 mg total) by mouth 2 (two) times daily as needed.  ? mirtazapine (REMERON) 30 MG tablet Take 30 mg by mouth at bedtime.  ? oxycodone (ROXICODONE) 30 MG immediate release tablet Take 30 mg by mouth every 3 (three)  hours.  ? pregabalin (LYRICA) 200 MG capsule Take 200 mg by mouth 3 (three) times daily.  ? prochlorperazine (COMPAZINE) 25 MG suppository Place 25 mg rectally every 12 (twelve) hours as needed for nausea or vomiting.  ? sildenafil (VIAGRA) 100 MG tablet Take 1 tablet (100 mg total) by mouth as needed for erectile dysfunction.  ? ? ?Review of Systems  ?Constitutional:  Positive for fatigue. Negative for chills and fever.  ?Respiratory:  Negative for cough, chest tightness, shortness of breath and wheezing.   ?Cardiovascular:  Negative for chest pain, palpitations and leg swelling.  ?Gastrointestinal:  Positive for abdominal pain and nausea.  ? ?Objective: ? ?BP (!) 96/58 (BP Location: Left Arm, Patient Position: Sitting, Cuff Size: Large)   Pulse 79   Temp 98.1 ?F (36.7 ?C) (Oral)   Ht 6' 5.5" (1.969 m)   Wt 257 lb 8 oz (116.8 kg)   SpO2 99%   BMI 30.14 kg/m?   Weight: 257 lb 8 oz (116.8 kg)  ? ?BP Readings from Last 3 Encounters:  ?11/16/21 (!) 96/58  ?11/07/21 90/62  ?11/01/21 108/71  ? ?Wt Readings from Last 3 Encounters:  ?11/16/21 257 lb 8 oz (116.8 kg)  ?11/07/21 256 lb (116.1 kg)  ?11/01/21 257 lb (116.6 kg)  ? ? ?Physical Exam ?Constitutional:   ?   General: He is not in acute distress. ?   Appearance: He is well-developed.  ?Cardiovascular:  ?   Rate and Rhythm: Normal rate and regular rhythm.  ?   Heart sounds: Normal heart sounds. No murmur heard. ?  No friction rub.  ?Pulmonary:  ?   Effort: Pulmonary effort is normal. No respiratory distress.  ?   Breath sounds: Normal breath sounds. No wheezing or rales.  ?Musculoskeletal:  ?   Right lower leg: No edema.  ?   Left lower leg: No edema.  ?Skin: ?   Comments: Scaling over most of scalp, more noted on the right side of scalp.  He does also have some flaking of skin over the scalp.  Erythematous patches under facial hair and alongside nasolabial folds with some skin scaling associated.  ?Neurological:  ?   Mental Status: He is alert and oriented to  person, place, and time.  ?Psychiatric:     ?   Behavior: Behavior normal.  ? ? ?Assessment/Plan ?1. Low testosterone ?Levels were previously low.  His energy is poor, but he has been through a lot with overall health which I suspect contributes.  We have referred him already to urology, but will confirm low testosterone with another labs today. ?- Testosterone; Future ?- Testosterone ? ?2. Seborrheic dermatitis ?Treatment with ketoconazole wash and fluocinonide for the scalp.  Let me know if not improving after couple weeks of treatment. ? ?3. Elevated bilirubin ?- Hepatic Function Panel; Future ?- Gamma GT; Future ?- Gamma GT ?- Hepatic Function Panel ? ?Return for pending lab or imaging results. ? ? ? ?40  minutes spent in chart review, time with patient, exam, follow up plan ? ? ?Micheline Rough, MD ?

## 2021-11-17 ENCOUNTER — Ambulatory Visit (INDEPENDENT_AMBULATORY_CARE_PROVIDER_SITE_OTHER): Payer: Medicare Other | Admitting: Neurology

## 2021-11-17 DIAGNOSIS — R0683 Snoring: Secondary | ICD-10-CM | POA: Diagnosis not present

## 2021-11-17 DIAGNOSIS — G478 Other sleep disorders: Secondary | ICD-10-CM

## 2021-11-17 DIAGNOSIS — E663 Overweight: Secondary | ICD-10-CM

## 2021-11-17 DIAGNOSIS — G4700 Insomnia, unspecified: Secondary | ICD-10-CM

## 2021-11-17 DIAGNOSIS — G4720 Circadian rhythm sleep disorder, unspecified type: Secondary | ICD-10-CM

## 2021-11-17 DIAGNOSIS — Z79891 Long term (current) use of opiate analgesic: Secondary | ICD-10-CM

## 2021-11-17 DIAGNOSIS — G4719 Other hypersomnia: Secondary | ICD-10-CM

## 2021-11-17 LAB — HEPATIC FUNCTION PANEL
ALT: 18 U/L (ref 0–53)
AST: 19 U/L (ref 0–37)
Albumin: 4.8 g/dL (ref 3.5–5.2)
Alkaline Phosphatase: 76 U/L (ref 39–117)
Bilirubin, Direct: 0.1 mg/dL (ref 0.0–0.3)
Total Bilirubin: 0.8 mg/dL (ref 0.2–1.2)
Total Protein: 7.3 g/dL (ref 6.0–8.3)

## 2021-11-17 LAB — GAMMA GT: GGT: 33 U/L (ref 7–51)

## 2021-11-17 LAB — TESTOSTERONE: Testosterone: 253.51 ng/dL — ABNORMAL LOW (ref 300.00–890.00)

## 2021-11-28 NOTE — Procedures (Signed)
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 The patient endorsed the Epworth Sleepiness Scale at 13 points. The patient's weight 257 pounds with a height of 78 (inches), resulting in a BMI of 29.8 kg/m2. The patient's neck circumference measured  inches. ? ?CURRENT MEDICATIONS: Ventolin HFA, Xanax, Dolophine, Remeron, Roxicodone, Lyrica, Compazine, Viagra, Buspar, Diltiazem gel, Paxil ?  ?PROCEDURE:  This is a multichannel digital polysomnogram utilizing the Somnostar 11.2 system.  Electrodes and sensors were applied and monitored per AASM Specifications.   EEG, EOG, Chin and Limb EMG, were sampled at 200 Hz.  ECG, Snore and Nasal Pressure, Thermal Airflow, Respiratory Effort, CPAP Flow and Pressure, Oximetry was sampled at 50 Hz. Digital video and audio were recorded.     ? ?BASELINE STUDY ? ?Lights Out was at 22:18 and Lights On at 04:18.  Total recording time (TRT) was 360.5 minutes, with a total sleep time (TST) of 277.5 minutes.   The patient's sleep latency was 4.5 minutes.  REM latency was 94 minutes.  The sleep efficiency was 77%.  ?   ?SLEEP ARCHITECTURE: WASO (Wake after sleep onset) was 53 minutes with mild to moderate sleep fragmentation noted.  There were 14 minutes in Stage N1, 209.5 minutes Stage N2, 0 minutes Stage N3 and 54 minutes in Stage REM.  The percentage of Stage N1 was 5.%, Stage N2 was 75.5%, which is markedly increased, Stage N3 was absent and Stage R (REM sleep) was 19.5%, which is normal. The arousals were noted  as: 29 were spontaneous, 0 were associated with PLMs, 2 were associated with respiratory events. ? ?RESPIRATORY ANALYSIS:  There were a total of 3 respiratory events:  0 obstructive apneas, 0 central apneas and 0 mixed apneas with a total of 0 apneas and an apnea index (AI) of 0 /hour. There were 3 hypopneas with a hypopnea index of .6 /hour. The patient also had 0 respiratory event related arousals (RERAs).  ?    ?The total APNEA/HYPOPNEA INDEX (AHI) was .6/hour and the total RESPIRATORY DISTURBANCE INDEX was  .6 /hour.  0 events occurred in REM sleep and 6 events in NREM. The REM AHI was  0 /hour, versus a non-REM AHI of .8. The patient spent 142 minutes of total sleep time in the supine position and 136 minutes in non-supine.. The supine AHI was 0.8 versus a non-supine AHI of 0.4. ? ?OXYGEN SATURATION & C02:  The Wake baseline 02 saturation was 92%, with the lowest being 89%. Time spent below 89% saturation equaled 0 minutes. ? ?PERIODIC LIMB MOVEMENTS: The patient had a total of 0 Periodic Limb Movements.  The Periodic Limb Movement (PLM) index was 0 and the PLM Arousal index was 0/hour. ? ?Audio and video analysis did not show any abnormal or unusual movements, behaviors, phonations or vocalizations. The patient took 3 bathroom breaks. Mild snoring was noted. The EKG was in keeping with normal sinus rhythm (NSR). ? ?Post-study, the patient indicated that sleep was better than usual.  ? ?IMPRESSION: ? ?Primary Snoring ?Dysfunctions  associated with sleep stages or arousal from sleep ? ?RECOMMENDATIONS: ? ?This study does not demonstrate any significant obstructive or central sleep disordered breathing with a total AHI of less than 5/hour and O2 nadir of 89%. Mild snoring was noted. Some weight loss and avoiding the supine sleep position may alleviate his snoring to some degree. Treatment with a positive airway pressure device, such as CPAP or autoPAP is not indicated. This study does not support an intrinsic  sleep disorder as a cause of the patient's symptoms. Other causes, including circadian rhythm disturbances, an underlying mood disorder, medication effect and/or an underlying medical problem cannot be ruled out. ?This study shows sleep fragmentation and abnormal sleep stage percentages; these are nonspecific findings and per se do not signify an intrinsic sleep disorder or a cause for the patient's sleep-related symptoms. Causes include (but are not limited to) the first night effect of the sleep study, circadian rhythm disturbances, medication effect or an underlying mood disorder or medical problem.  ?The patient should be cautioned not to drive, work at heights, or operate dangerous or heavy equipment when tired or sleepy. Review and reiteration of good sleep hygiene measures should be pursued with any patient. ?The patient will be advised to follow up with the referring provider, who will be notified of the test results. ? ?I certify that I have reviewed the entire raw data recording prior to the issuance of this report in accordance with the Standards of Accreditation of the [place] [employer] (AASM) ? ? ?Star Age, MD, PhD ?Diplomat, American Board of Neurology and Sleep Medicine (Neurology and Sleep Medicine) ? ? ? ? ?

## 2021-11-29 ENCOUNTER — Telehealth: Payer: Self-pay | Admitting: *Deleted

## 2021-11-29 NOTE — Telephone Encounter (Signed)
-----   Message from Star Age, MD sent at 11/28/2021  5:51 PM EDT ----- ?Patient referred by Dr. Ethlyn Gallery, seen by me on 11/01/21, diagnostic PSG on 11/17/21.   ?Please call and notify the patient that the recent sleep study did not show any significant obstructive sleep apnea with a total AHI of less than 5/hour and O2 nadir of 89%. Mild snoring was noted. Some weight loss and avoiding the supine sleep position may alleviate his snoring to some degree. Treatment with a positive airway pressure device, such as CPAP or autoPAP is not indicated. At this juncture, he can followup with his PCP.  ? ?Thanks, ? ?Star Age, MD, PhD ?[facility]) ? ?

## 2021-11-29 NOTE — Telephone Encounter (Signed)
Called pt & LVM (ok per DPR) advising patient of sleep study results. Sleep study did not show any significant obstructive sleep apnea with a total AHI of less than 5/hour and O2 nadir of 89%. Mild snoring was noted. Some weight loss and avoiding the supine sleep position may alleviate his snoring to some degree. Treatment with a positive airway pressure device, such as CPAP or autoPAP is not indicated. At this juncture, he can followup with his PCP. Left office number for call back or he can send a mychart message if he has any questions. Results available on mychart. Also advised results have been sent over to his PCP Dr Ethlyn Gallery.  ? ?

## 2021-12-12 ENCOUNTER — Encounter: Payer: Self-pay | Admitting: Cardiology

## 2021-12-12 ENCOUNTER — Encounter: Payer: Self-pay | Admitting: *Deleted

## 2021-12-12 ENCOUNTER — Ambulatory Visit (INDEPENDENT_AMBULATORY_CARE_PROVIDER_SITE_OTHER): Payer: Medicare Other | Admitting: Cardiology

## 2021-12-12 VITALS — BP 90/62 | HR 91 | Ht 78.0 in | Wt 261.0 lb

## 2021-12-12 DIAGNOSIS — I471 Supraventricular tachycardia: Secondary | ICD-10-CM

## 2021-12-12 DIAGNOSIS — Z01812 Encounter for preprocedural laboratory examination: Secondary | ICD-10-CM

## 2021-12-12 NOTE — Patient Instructions (Signed)
Medication Instructions:  ?Your physician recommends that you continue on your current medications as directed. Please refer to the Current Medication list given to you today. ? ?*If you need a refill on your cardiac medications before your next appointment, please call your pharmacy* ? ? ?Lab Work: ?Pre procedure lab  ?If you have labs (blood work) drawn today and your tests are completely normal, you will receive your results only by: ?MyChart Message (if you have MyChart) OR ?A paper copy in the mail ?If you have any lab test that is abnormal or we need to change your treatment, we will call you to review the results. ? ? ?Testing/Procedures: ?Your physician has recommended that you have an ablation. Catheter ablation is a medical procedure used to treat some cardiac arrhythmias (irregular heartbeats). During catheter ablation, a long, thin, flexible tube is put into a blood vessel in your groin (upper thigh), or neck. This tube is called an ablation catheter. It is then guided to your heart through the blood vessel. Radio frequency waves destroy small areas of heart tissue where abnormal heartbeats may cause an arrhythmia to start. Please see the instruction sheet given to you today. ? ? ?Follow-Up: ?At [facility], you and your health needs are our priority.  As part of our continuing mission to provide you with exceptional heart care, we have created designated Provider Care Teams.  These Care Teams include your primary Cardiologist (physician) and Advanced Practice Providers (APPs -  Physician Assistants and Nurse Practitioners) who all work together to provide you with the care you need, when you need it. ? ?Your next appointment:   ?1 month(s) after your ablation ? ?The format for your next appointment:   ?In Person ? ?Provider:   ?Allegra Lai, MD ? ? ? ?Thank you for choosing [facility]!! ? ? ?Trinidad Curet, RN ?[phone] ? ? ?Other Instructions ? ?Cardiac Ablation ?Cardiac ablation is a  procedure to destroy (ablate) some heart tissue that is sending bad signals. These bad signals cause problems in heart rhythm. ?The heart has many areas that make these signals. If there are problems in these areas, they can make the heart beat in a way that is not normal. Destroying some tissues can help make the heart rhythm normal. ?Tell your doctor about: ?Any allergies you have. ?All medicines you are taking. These include vitamins, herbs, eye drops, creams, and over-the-counter medicines. ?Any problems you or family members have had with medicines that make you fall asleep (anesthetics). ?Any blood disorders you have. ?Any surgeries you have had. ?Any medical conditions you have, such as kidney failure. ?Whether you are pregnant or may be pregnant. ?What are the risks? ?This is a safe procedure. But problems may occur, including: ?Infection. ?Bruising and bleeding. ?Bleeding into the chest. ?Stroke or blood clots. ?Damage to nearby areas of your body. ?Allergies to medicines or dyes. ?The need for a pacemaker if the normal system is damaged. ?Failure of the procedure to treat the problem. ?What happens before the procedure? ?Medicines ?Ask your doctor about: ?Changing or stopping your normal medicines. This is important. ?Taking aspirin and ibuprofen. Do not take these medicines unless your doctor tells you to take them. ?Taking other medicines, vitamins, herbs, and supplements. ?General instructions ?Follow instructions from your doctor about what you cannot eat or drink. ?Plan to have someone take you home from the hospital or clinic. ?If you will be going home right after the procedure, plan to have someone with you for 24  hours. ?Ask your doctor what steps will be taken to prevent infection. ?What happens during the procedure? ? ?An IV tube will be put into one of your veins. ?You will be given a medicine to help you relax. ?The skin on your neck or groin will be numbed. ?A cut (incision) will be made in  your neck or groin. A needle will be put through your cut and into a large vein. ?A tube (catheter) will be put into the needle. The tube will be moved to your heart. ?Dye may be put through the tube. This helps your doctor see your heart. ?Small devices (electrodes) on the tube will send out signals. ?A type of energy will be used to destroy some heart tissue. ?The tube will be taken out. ?Pressure will be held on your cut. This helps stop bleeding. ?A bandage will be put over your cut. ?The exact procedure may vary among doctors and hospitals. ?What happens after the procedure? ?You will be watched until you leave the hospital or clinic. This includes checking your heart rate, breathing rate, oxygen, and blood pressure. ?Your cut will be watched for bleeding. You will need to lie still for a few hours. ?Do not drive for 24 hours or as long as your doctor tells you. ?Summary ?Cardiac ablation is a procedure to destroy some heart tissue. This is done to treat heart rhythm problems. ?Tell your doctor about any medical conditions you may have. Tell him or her about all medicines you are taking to treat them. ?This is a safe procedure. But problems may occur. These include infection, bruising, bleeding, and damage to nearby areas of your body. ?Follow what your doctor tells you about food and drink. You may also be told to change or stop some of your medicines. ?After the procedure, do not drive for 24 hours or as long as your doctor tells you. ?This information is not intended to replace advice given to you by your health care provider. Make sure you discuss any questions you have with your health care provider. ?Document Revised: 07/03/2019 Document Reviewed: 07/03/2019 ?Elsevier Patient Education ? [employer]. ? ?

## 2021-12-12 NOTE — Progress Notes (Signed)
? ?Electrophysiology Office Note ? ? ?Date:  12/12/2021  ? ?ID:  Jonathan Marsh, DOB October 07, 1985, MRN [id number] ? ?PCP:  Caren Macadam, MD  ?Cardiologist:  Irish Lack ?Primary Electrophysiologist:  Careen Mauch Meredith Leeds, MD   ? ?Chief Complaint: palpitations ?  ?History of Present Illness: ?Jonathan Marsh is a 36 y.o. male who is being seen today for the evaluation of palpitations at the request of Jettie Booze, MD. Presenting today for electrophysiology evaluation. ? ?He has a history of Gardner syndrome, FAP.  He had a colectomy at age 82.  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  He was treated with chemotherapy.  Tumor size increased.  He was put on a clinical trial and got placebo therapy.  The tumor shrunk with placebo therapy.  His last chemotherapy was January 2022.  He did have a reduction in his ejection fraction.  Most recent echo shows an ejection fraction of 50 to 55%.   ? ?Since January 2023, he has had multiple episodes of dizziness and lightheadedness.  This occurs mainly when he stands up or is moving around.  His heart rates for the patient to get up into the 150s to 180s.  He wore a cardiac monitor that showed both sinus tachycardia and possible episodes of SVT. ? ?Today, he denies symptoms of chest pain, shortness of breath, orthopnea, PND, lower extremity edema, claudication, dizziness, presyncope, syncope, bleeding, or neurologic sequela. The patient is tolerating medications without difficulties.  His continued episodes of palpitations.  He has no chest pain associated with these palpitations.  His blood pressure is low and thus he has not tolerated many heart rate medications. ? ? ?Past Medical History:  ?Diagnosis Date  ? Ampullary adenoma   ? Desmoid tumor of abdomen   ? Gardner syndrome   ? Gastric polyp   ? GERD (gastroesophageal reflux disease)   ? Headache   ? mild - stress related  ? Multiple gastric polyps   ? Polyp of duodenum   ? ?Past Surgical History:   ?Procedure Laterality Date  ? APPENDECTOMY    ? COLECTOMY    ? age 80 due to gardners syndrome- has j pouch reconstruction  ? COLONOSCOPY    ? ESOPHAGOGASTRODUODENOSCOPY N/A 03/30/2015  ? Procedure: ESOPHAGOGASTRODUODENOSCOPY (EGD);  Surgeon: Irene Shipper, MD;  Location: [facility];  Service: Endoscopy;  Laterality: N/A;  need side viewing scope  ? ESOPHAGOGASTRODUODENOSCOPY N/A 01/18/2016  ? Procedure: ESOPHAGOGASTRODUODENOSCOPY (EGD);  Surgeon: Irene Shipper, MD;  Location: [facility];  Service: Endoscopy;  Laterality: N/A;   use ercp scope   ? ESOPHAGOGASTRODUODENOSCOPY (EGD) WITH PROPOFOL N/A 07/19/2018  ? Procedure: ESOPHAGOGASTRODUODENOSCOPY (EGD) WITH PROPOFOL;  Surgeon: Irene Shipper, MD;  Location: [facility];  Service: Endoscopy;  Laterality: N/A;  ERCP SCOPE  ? UPPER GASTROINTESTINAL ENDOSCOPY    ? ? ? ?Current Outpatient Medications  ?Medication Sig Dispense Refill  ? albuterol (VENTOLIN HFA) 108 (90 Base) MCG/ACT inhaler Inhale 1-2 puffs into the lungs every 6 (six) hours as needed for wheezing or shortness of breath. 8 g 0  ? Alpha-Lipoic Acid 600 MG CAPS Take 1 capsule by mouth in the morning and at bedtime.    ? ALPRAZolam (XANAX) 0.5 MG tablet Take 0.5 mg by mouth at bedtime as needed for anxiety.    ? fluconazole (DIFLUCAN) 150 MG tablet Take 1 tablet PO x1, repeat in 3 days 2 tablet 0  ? fluocinolone (SYNALAR) 0.01 % external solution Apply topically  2 (two) times daily. 60 mL 2  ? gabapentin (NEURONTIN) 300 MG capsule Take 600 mg by mouth in the morning, at noon, and at bedtime.    ? ketoconazole (NIZORAL) 2 % shampoo Apply 1 application. topically 2 (two) times a week. 120 mL 5  ? Melatonin 10 MG CAPS Take 1 tablet by mouth at bedtime as needed.    ? methadone (DOLOPHINE) 10 MG tablet Take 10 mg by mouth 4 (four) times daily.    ? metoprolol tartrate (LOPRESSOR) 25 MG tablet Take 0.5 tablets (12.5 mg total) by mouth 2 (two) times daily as needed. 90 tablet 2  ? mirtazapine (REMERON) 30 MG  tablet Take 30 mg by mouth at bedtime.    ? oxycodone (ROXICODONE) 30 MG immediate release tablet Take 30 mg by mouth every 3 (three) hours.    ? prochlorperazine (COMPAZINE) 25 MG suppository Place 25 mg rectally every 12 (twelve) hours as needed for nausea or vomiting.    ? sildenafil (VIAGRA) 100 MG tablet Take 1 tablet (100 mg total) by mouth as needed for erectile dysfunction. 10 tablet 0  ? ?No current facility-administered medications for this visit.  ? ? ?Allergies:   Ondansetron and Ondansetron hcl  ? ?Social History:  The patient  reports that he quit smoking about 13 years ago. His smoking use included cigarettes. He has never been exposed to tobacco smoke. He has never used smokeless tobacco. He reports that he does not drink alcohol and does not use drugs.  ? ?Family History:  The patient's family history includes Brain cancer in his paternal grandmother; Cancer in his mother; Colon cancer in his maternal grandfather; Colon polyps in his maternal grandfather and mother; Drug abuse in his mother; Familial polyposis in his mother; Liver cancer in his paternal grandmother; Lung cancer in his maternal grandmother; Other in his maternal grandfather and mother.  ? ? ?ROS:  Please see the history of present illness.   Otherwise, review of systems is positive for none.   All other systems are reviewed and negative.  ? ? ?PHYSICAL EXAM: ?VS:  BP 90/62   Pulse 91   Ht '6\' 6"'$  (1.981 m)   Wt 261 lb (118.4 kg)   SpO2 96%   BMI 30.16 kg/m?  , BMI Body mass index is 30.16 kg/m?. ?GEN: Well nourished, well developed, in no acute distress  ?HEENT: normal  ?Neck: no JVD, carotid bruits, or masses ?Cardiac: RRR; no murmurs, rubs, or gallops,no edema  ?Respiratory:  clear to auscultation bilaterally, normal work of breathing ?GI: soft, nontender, nondistended, + BS ?MS: no deformity or atrophy  ?Skin: warm and dry ?Neuro:  Strength and sensation are intact ?Psych: euthymic mood, full affect ? ?EKG:  EKG is not ordered  today. ?Personal review of the ekg ordered 11/07/21 shows sinus rhythm, rate 82 ? ?Recent Labs: ?10/26/2021: BUN 12; Creatinine, Ser 1.06; Hemoglobin 14.9; Platelets 310.0; Potassium 3.7; Sodium 140; TSH 2.82 ?11/16/2021: ALT 18  ? ? ?Lipid Panel  ?   ?Component Value Date/Time  ? CHOL 181 10/26/2021 0950  ? TRIG 195.0 (H) 10/26/2021 0950  ? HDL 41.80 10/26/2021 0950  ? CHOLHDL 4 10/26/2021 0950  ? VLDL 39.0 10/26/2021 0950  ? [place] 100 (H) 10/26/2021 0950  ? ? ? ?Wt Readings from Last 3 Encounters:  ?12/12/21 261 lb (118.4 kg)  ?11/16/21 257 lb 8 oz (116.8 kg)  ?11/07/21 256 lb (116.1 kg)  ?  ? ? ?Other studies Reviewed: ?Additional studies/ records that  were reviewed today include: TTE 11/15/21  ?Review of the above records today demonstrates:  ? ? 1. Left ventricular ejection fraction, by estimation, is 50 to 55%. The  ?left ventricle has low normal function. The left ventricle has no regional  ?wall motion abnormalities. Left ventricular diastolic parameters are  ?consistent with Grade I diastolic  ?dysfunction (impaired relaxation).  ? 2. Right ventricular systolic function is normal. The right ventricular  ?size is mildly enlarged. Tricuspid regurgitation signal is inadequate for  ?assessing PA pressure.  ? 3. The mitral valve is normal in structure. Trivial mitral valve  ?regurgitation.  ? 4. The aortic valve is tricuspid. Aortic valve regurgitation is not  ?visualized. No aortic stenosis is present.  ? 5. Aortic dilatation noted. There is mild dilatation of the aortic root,  ?measuring 40 mm.  ? ?Cardiac monitor 10/11/21 personally reviewed ?Sinus rhythm including NSR, sinus tachycardia, sinus bradycardia ?Frequent episodes of SVT - HR as high as 150s-160 ? ? ?ASSESSMENT AND PLAN: ? ?1.  Palpitations with possible SVT: Unfortunately his blood pressure is low and thus therapy for tachycardia is difficult.  He would prefer an attempted ablation prior to medication management.  Of note, this all started after his  chemotherapy.  Risk and benefits of ablation were discussed include bleeding, tamponade, heart block, stroke, among others.  He understands these risks and is agreed to the procedure.  Based on his monitor, the patient

## 2021-12-15 ENCOUNTER — Ambulatory Visit (INDEPENDENT_AMBULATORY_CARE_PROVIDER_SITE_OTHER): Payer: Medicare Other | Admitting: Internal Medicine

## 2021-12-15 ENCOUNTER — Encounter: Payer: Self-pay | Admitting: Internal Medicine

## 2021-12-15 VITALS — BP 100/70 | HR 82 | Ht 78.0 in | Wt 259.0 lb

## 2021-12-15 DIAGNOSIS — D135 Benign neoplasm of extrahepatic bile ducts: Secondary | ICD-10-CM

## 2021-12-15 DIAGNOSIS — D481 Neoplasm of uncertain behavior of connective and other soft tissue: Secondary | ICD-10-CM | POA: Diagnosis not present

## 2021-12-15 DIAGNOSIS — K602 Anal fissure, unspecified: Secondary | ICD-10-CM

## 2021-12-15 DIAGNOSIS — R1903 Right lower quadrant abdominal swelling, mass and lump: Secondary | ICD-10-CM

## 2021-12-15 DIAGNOSIS — K625 Hemorrhage of anus and rectum: Secondary | ICD-10-CM

## 2021-12-15 DIAGNOSIS — R1031 Right lower quadrant pain: Secondary | ICD-10-CM

## 2021-12-15 DIAGNOSIS — G8929 Other chronic pain: Secondary | ICD-10-CM

## 2021-12-15 MED ORDER — DILTIAZEM GEL 2 %: 1.0000 "application " | Freq: Every day | CUTANEOUS | 2 refills | Status: DC

## 2021-12-15 NOTE — Patient Instructions (Signed)
If you are age 37 or older, your body mass index should be between 23-30. Your Body mass index is 29.93 kg/m?Marland Kitchen If this is out of the aforementioned range listed, please consider follow up with your Primary Care Provider. ? ?If you are age 63 or younger, your body mass index should be between 19-25. Your Body mass index is 29.93 kg/m?Marland Kitchen If this is out of the aformentioned range listed, please consider follow up with your Primary Care Provider.  ? ?________________________________________________________ ? ?The [facility] providers would like to encourage you to use [facility] to communicate with providers for non-urgent requests or questions.  Due to long hold times on the telephone, sending your provider a message by [facility] may be a faster and more efficient way to get a response.  Please allow 48 business hours for a response.  Please remember that this is for non-urgent requests.  ?_______________________________________________________ ? ?Your provider has prescribed Diltiazem gel for you. Please follow the directions written on your prescription bottle or given to you specifically by your provider. Since this is a specialty medication and is not readily available at most local pharmacies, we have sent your prescription to: ? ?[facility]'s information is below: ?Address: [address] [place], [place] [address]  ?[phone] ? ?*Please DO NOT go directly from our office to pick up this medication! Give the pharmacy 1 day to process the prescription as this is compounded and takes time to make. ? ? ?2 tablespoons of Citrucel daily ? ?Sitz baths ? ?

## 2021-12-15 NOTE — Progress Notes (Signed)
HISTORY OF PRESENT ILLNESS: ? ?Jonathan Marsh is a 36 y.o. male with FAP/Gardner syndrome status post remote total abdominal colectomy, history of ampullary adenoma status post endoscopic ampullectomy, history of progressive desmoid tumor of the abdomen being followed actively treated at [facility].  Last evaluation evaluation here October 2021 with chief complaint of rectal bleeding.  He was diagnosed with a posterior anal fissure and treated accordingly.  He has not been seen since. ? ?He presents today regarding rectal pain with minor rectal discomfort consistent with recurrent fissure, abnormal liver test per his PCP for which she was told to contact GI for evaluation, and questioning the need for surveillance upper endoscopy. ? ?First, he tells me that previous treatment with diltiazem resulted in resolution of his fissure.  He had no trouble for many months.  However has had recurrent symptoms in recent months.  He notices some blood about every other day.  Not a lot.  He does describe a sensation of straining with defecation though he has multiple mostly soft or loose bowel movements per day.  He continues to struggle with chronic pain related to his desmoid tumor for which she is on chronic narcotics.  He is being evaluated for placement of a neural stimulator in hopes of getting off narcotics.  He also has been having issues with tachycardia, possibly SVT, for which he is being evaluated by electrophysiology.  He is no longer on chemotherapy.  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 ? ?He tells me that blood work in March revealed abnormal liver test.  I have reviewed those laboratories.  His liver tests were normal except for bilirubin of 1.6.  Repeat liver test November 16, 2021 were entirely normal. ? ?He last underwent surveillance upper endoscopy with side-viewing scope December 2019.  He was noted to have multiple small gastric polyps.  The area of prior ampullary adenoma was unremarkable.  Repeat EGD in 1  year recommended.  He is interested in pursuing that examination. ?  ? ?REVIEW OF SYSTEMS: ? ?All non-GI ROS negative unless otherwise stated in the HPI. ?Past Medical History:  ?Diagnosis Date  ? Ampullary adenoma   ? Desmoid tumor of abdomen   ? Gardner syndrome   ? Gastric polyp   ? GERD (gastroesophageal reflux disease)   ? Headache   ? mild - stress related  ? Multiple gastric polyps   ? Polyp of duodenum   ? ? ?Past Surgical History:  ?Procedure Laterality Date  ? APPENDECTOMY    ? COLECTOMY    ? age 89 due to gardners syndrome- has j pouch reconstruction  ? COLONOSCOPY    ? ESOPHAGOGASTRODUODENOSCOPY N/A 03/30/2015  ? Procedure: ESOPHAGOGASTRODUODENOSCOPY (EGD);  Surgeon: Irene Shipper, MD;  Location: [facility];  Service: Endoscopy;  Laterality: N/A;  need side viewing scope  ? ESOPHAGOGASTRODUODENOSCOPY N/A 01/18/2016  ? Procedure: ESOPHAGOGASTRODUODENOSCOPY (EGD);  Surgeon: Irene Shipper, MD;  Location: [facility];  Service: Endoscopy;  Laterality: N/A;   use ercp scope   ? ESOPHAGOGASTRODUODENOSCOPY (EGD) WITH PROPOFOL N/A 07/19/2018  ? Procedure: ESOPHAGOGASTRODUODENOSCOPY (EGD) WITH PROPOFOL;  Surgeon: Irene Shipper, MD;  Location: [facility];  Service: Endoscopy;  Laterality: N/A;  ERCP SCOPE  ? UPPER GASTROINTESTINAL ENDOSCOPY    ? ? ?Social History ?Jonathan Marsh  reports that he quit smoking about 13 years ago. His smoking use included cigarettes. He has never been exposed to tobacco smoke. He has never used smokeless tobacco. He reports that he does not drink  alcohol and does not use drugs. ? ?family history includes Brain cancer in his paternal grandmother; Cancer in his mother; Colon cancer in his maternal grandfather; Colon polyps in his maternal grandfather and mother; Drug abuse in his mother; Familial polyposis in his mother; Liver cancer in his paternal grandmother; Lung cancer in his maternal grandmother; Other in his maternal grandfather and mother. ? ?Allergies  ?Allergen Reactions  ?  Ondansetron Anaphylaxis and Rash  ?  Other reaction(s): Headache ?Other reaction(s): Other, Other (See Comments) ?Headache ?Other reaction(s): Headache,Migraine ?Other reaction(s): Headache,Migraine ?Other reaction(s): Headache,Migraine ?Headache ?Headache ?Other reaction(s): Headache,Migraine ?Headache  ? Ondansetron Hcl Other (See Comments)  ?  Severe headache  ? ? ?  ? ?PHYSICAL EXAMINATION: ?Vital signs: Ht '6\' 6"'$  (1.981 m)   Wt 259 lb (117.5 kg)   BMI 29.93 kg/m?   ?Constitutional: generally well-appearing, no acute distress ?Psychiatric: alert and oriented x3, cooperative ?Eyes: extraocular movements intact, anicteric, conjunctiva pink ?Mouth: oral pharynx moist, no lesions ?Neck: supple no lymphadenopathy ?Cardiovascular: heart regular rate and rhythm, no murmur ?Lungs: clear to auscultation bilaterally ?Abdomen: soft, firm tender mass right lower quadrant, no obvious ascites, no peritoneal signs, normal bowel sounds, no organomegaly ?Rectal: Omitted ?Extremities: no clubbing, cyanosis, or lower extremity edema bilaterally ?Skin: no lesions on visible extremities ?Neuro: No focal deficits.  Cranial nerves intact ? ?ASSESSMENT: ? ?1.  Recurrent anal fissure. ?2.  Irregular bowel habits ?3.  Mild elevation of bilirubin on 1 set of liver test.  Repeat liver tests normal.  Nothing further needs to be done ?4.  History of ampullary adenoma status post endoscopic resection and multiple gastric polyps in a patient with Gardner syndrome.  Last surveillance exam December 2019 ?5.  Gardner syndrome status post total abdominal colectomy ?6.  Desmoid tumor.  Ongoing ?7.  Chronic pain related to his desmoid tumor ? ?PLAN: ? ?1.  Prescribe diltiazem 2% ointment.  Apply 5 times daily as instructed ?2.  Citrucel 2 tablespoons daily in 14 ounces of water or juice ?3.  Sitz baths once or twice daily ?4.  Schedule EGD with side-viewing scope for upper GI surveillance in a patient with Gardner syndrome and history of ampullary  adenoma status post resection.  This will be performed at the hospital.The nature of the procedure, as well as the risks, benefits, and alternatives were carefully and thoroughly reviewed with the patient. Ample time for discussion and questions allowed. The patient understood, was satisfied, and agreed to proceed.  ?5.  Ongoing medical care with multiple other specialists. ? ? ? ? ?  ?

## 2022-01-12 ENCOUNTER — Telehealth: Payer: Self-pay | Admitting: *Deleted

## 2022-01-12 NOTE — Telephone Encounter (Signed)
   Pre-operative Risk Assessment    Patient Name: Jonathan Marsh  DOB: October 18, 1985 MRN: [id number]      Request for Surgical Clearance    Procedure:   B/L SPINAL CORD STIMULATOR  Date of Surgery:  Clearance 01/17/22  STAT PER CLEARANCE REQUEST                               Surgeon:  DR. Shirlyn Goltz Surgeon's Group or Practice Name:  [place] Phone number:  [phone] Fax number:  [phone]   Type of Clearance Requested:   - Medical ; NO MEDICATIONS BEING REQUESTED TO BE HELD   Type of Anesthesia:  MAC   Additional requests/questions:    Jiles Prows   01/12/2022, 10:50 AM

## 2022-01-13 NOTE — Telephone Encounter (Signed)
   Name: Jonathan Marsh  DOB: 12-24-1985  MRN: [id number]   Primary Cardiologist: Larae Grooms, MD  Chart reviewed as part of pre-operative protocol coverage. Patient was contacted 01/13/2022 in reference to pre-operative risk assessment for pending surgery as outlined below.  Jonathan Marsh was last seen on 12/12/2021 by Dr. Curt Bears.  Since that day, Jonathan Marsh has done well and has no adverse change in his condition. His RC RI risk index score is 0.4 and he is able to complete 5 METS of activity without any chest pain or shortness of breath.  Therefore, based on ACC/AHA guidelines, the patient would be at acceptable risk for the planned procedure without further cardiovascular testing.   The patient was advised that if he develops new symptoms prior to surgery to contact our office to arrange for a follow-up visit, and he verbalized understanding.  I will route this recommendation to the requesting party via Epic fax function and remove from pre-op pool. Please call with questions.  Mable Fill, Marissa Nestle, NP 01/13/2022, 1:52 PM

## 2022-01-13 NOTE — Telephone Encounter (Signed)
Follow Up:     Jonathan Marsh is callingg to check on the status of patient's clearance.

## 2022-01-19 ENCOUNTER — Telehealth: Payer: Self-pay

## 2022-01-19 NOTE — Telephone Encounter (Signed)
Transition Care Management Follow-up Telephone Call Date of discharge and from where: [facility] 01/18/22 How have you been since you were released from the hospital? Pt states he had procedure done & aspirated during procedure. Left AMA. States he has a mild fever that has resolved. Any questions or concerns? No  Items Reviewed: Did the pt receive and understand the discharge instructions provided?  Left AMA   Home Care and Equipment/Supplies: Were home health services ordered? not applicable   Follow up appointments reviewed:  PCP Hospital f/u appt confirmed? No  Pt declines appt at this time. If their condition worsens, is the pt aware to call PCP or go to the Emergency Dept.? Yes Was the patient provided with contact information for the PCP's office or ED? Yes Was to pt encouraged to call back with questions or concerns? Yes

## 2022-01-24 ENCOUNTER — Encounter (HOSPITAL_COMMUNITY): Payer: Self-pay | Admitting: Internal Medicine

## 2022-01-25 ENCOUNTER — Encounter: Payer: Self-pay | Admitting: Internal Medicine

## 2022-02-02 ENCOUNTER — Ambulatory Visit (HOSPITAL_COMMUNITY): Payer: Medicare Other | Admitting: Anesthesiology

## 2022-02-02 ENCOUNTER — Other Ambulatory Visit: Payer: Self-pay

## 2022-02-02 ENCOUNTER — Ambulatory Visit (HOSPITAL_COMMUNITY)
Admission: RE | Admit: 2022-02-02 | Discharge: 2022-02-02 | Disposition: A | Discharge status: Home / Self Care | Payer: Medicare Other | Attending: Internal Medicine | Admitting: Internal Medicine

## 2022-02-02 ENCOUNTER — Encounter (HOSPITAL_COMMUNITY): Payer: Self-pay | Admitting: Internal Medicine

## 2022-02-02 ENCOUNTER — Encounter (HOSPITAL_COMMUNITY)
Admission: RE | Disposition: A | Discharge status: Home / Self Care | Payer: Self-pay | Source: Home / Self Care | Attending: Internal Medicine

## 2022-02-02 ENCOUNTER — Ambulatory Visit (HOSPITAL_BASED_OUTPATIENT_CLINIC_OR_DEPARTMENT_OTHER): Payer: Medicare Other | Admitting: Anesthesiology

## 2022-02-02 DIAGNOSIS — Z09 Encounter for follow-up examination after completed treatment for conditions other than malignant neoplasm: Secondary | ICD-10-CM

## 2022-02-02 DIAGNOSIS — F419 Anxiety disorder, unspecified: Secondary | ICD-10-CM

## 2022-02-02 DIAGNOSIS — Z1509 Genetic susceptibility to other malignant neoplasm: Secondary | ICD-10-CM | POA: Diagnosis not present

## 2022-02-02 DIAGNOSIS — K602 Anal fissure, unspecified: Secondary | ICD-10-CM

## 2022-02-02 DIAGNOSIS — K317 Polyp of stomach and duodenum: Secondary | ICD-10-CM | POA: Diagnosis not present

## 2022-02-02 DIAGNOSIS — Z79891 Long term (current) use of opiate analgesic: Secondary | ICD-10-CM | POA: Insufficient documentation

## 2022-02-02 DIAGNOSIS — Z9049 Acquired absence of other specified parts of digestive tract: Secondary | ICD-10-CM | POA: Diagnosis not present

## 2022-02-02 DIAGNOSIS — Z87891 Personal history of nicotine dependence: Secondary | ICD-10-CM | POA: Insufficient documentation

## 2022-02-02 DIAGNOSIS — Z8719 Personal history of other diseases of the digestive system: Secondary | ICD-10-CM | POA: Insufficient documentation

## 2022-02-02 DIAGNOSIS — D135 Benign neoplasm of extrahepatic bile ducts: Secondary | ICD-10-CM

## 2022-02-02 DIAGNOSIS — Z800 Family history of malignant neoplasm of digestive organs: Secondary | ICD-10-CM | POA: Diagnosis not present

## 2022-02-02 DIAGNOSIS — Z86010 Personal history of colonic polyps: Secondary | ICD-10-CM | POA: Diagnosis not present

## 2022-02-02 HISTORY — PX: ESOPHAGOGASTRODUODENOSCOPY (EGD) WITH PROPOFOL: SHX5813

## 2022-02-02 HISTORY — DX: Other complications of anesthesia, initial encounter: T88.59XA

## 2022-02-02 SURGERY — ESOPHAGOGASTRODUODENOSCOPY (EGD) WITH PROPOFOL
Anesthesia: Monitor Anesthesia Care

## 2022-02-02 MED ORDER — PROPOFOL 500 MG/50ML IV EMUL
INTRAVENOUS | Status: DC | PRN
  Administered 2022-02-02: 150 ug/kg/min via INTRAVENOUS

## 2022-02-02 MED ORDER — PROPOFOL 1000 MG/100ML IV EMUL
INTRAVENOUS | Status: AC
  Filled 2022-02-02: qty 100

## 2022-02-02 MED ORDER — PROPOFOL 10 MG/ML IV BOLUS
INTRAVENOUS | Status: DC | PRN
  Administered 2022-02-02: 20 mg via INTRAVENOUS

## 2022-02-02 MED ORDER — SODIUM CHLORIDE 0.9 % IV SOLN: INTRAVENOUS | Status: DC

## 2022-02-02 MED ORDER — LACTATED RINGERS IV SOLN: INTRAVENOUS | Status: DC | PRN

## 2022-02-02 MED ORDER — LIDOCAINE HCL 1 % IJ SOLN
INTRAMUSCULAR | Status: DC | PRN
  Administered 2022-02-02: 100 mg via INTRADERMAL

## 2022-02-02 SURGICAL SUPPLY — 15 items

## 2022-02-02 NOTE — H&P (Signed)
HISTORY OF PRESENT ILLNESS:   Jonathan Marsh is a 36 y.o. 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  Last evaluation evaluation here October 2021 with chief complaint of rectal bleeding.  He was diagnosed with a posterior anal fissure and treated accordingly.  He has not been seen since.  He presents today regarding rectal pain with minor rectal discomfort consistent with recurrent fissure, abnormal liver test per his PCP for which she was told to contact GI for evaluation, and questioning the need for surveillance upper endoscopy.  First, he tells me that previous treatment with diltiazem resulted in resolution of his fissure.  He had no trouble for many months.  However has had recurrent symptoms in recent months.  He notices some blood about every other day.  Not a lot.  He does describe a sensation of straining with defecation though he has multiple mostly soft or loose bowel movements per day.  He continues to struggle with chronic pain related to his desmoid tumor for which she is on chronic narcotics.  He is being evaluated for placement of a neural stimulator in hopes of getting off narcotics.  He also has been having issues with tachycardia, possibly SVT, for which he is being evaluated by electrophysiology.  He is no longer on chemotherapy.  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  He tells me that blood work in March revealed abnormal liver test.  I have reviewed those laboratories.  His liver tests were normal except for bilirubin of 1.6.  Repeat liver test November 16, 2021 were entirely normal.  He last underwent surveillance upper endoscopy with side-viewing scope December 2019.  He was noted to have multiple small gastric polyps.  The area of prior ampullary adenoma was unremarkable.  Repeat EGD in  1 year recommended.  He is interested in pursuing that examination.     REVIEW OF SYSTEMS:   All non-GI ROS negative unless otherwise stated in the HPI.     Past Medical History:  Diagnosis Date   Ampullary adenoma     Desmoid tumor of abdomen     Gardner syndrome     Gastric polyp     GERD (gastroesophageal reflux disease)     Headache      mild - stress related   Multiple gastric polyps     Polyp of duodenum             Past Surgical History:  Procedure Laterality Date   APPENDECTOMY       COLECTOMY        age 65 due to gardners syndrome- has j pouch reconstruction   COLONOSCOPY       ESOPHAGOGASTRODUODENOSCOPY N/A 03/30/2015    Procedure: ESOPHAGOGASTRODUODENOSCOPY (EGD);  Surgeon: Irene Shipper, MD;  Location: [facility];  Service: Endoscopy;  Laterality: N/A;  need side viewing scope   ESOPHAGOGASTRODUODENOSCOPY N/A 01/18/2016    Procedure: ESOPHAGOGASTRODUODENOSCOPY (EGD);  Surgeon: Irene Shipper, MD;  Location: [facility];  Service: Endoscopy;  Laterality: N/A;   use ercp scope    ESOPHAGOGASTRODUODENOSCOPY (EGD) WITH PROPOFOL N/A 07/19/2018    Procedure: ESOPHAGOGASTRODUODENOSCOPY (EGD) WITH PROPOFOL;  Surgeon: Irene Shipper, MD;  Location: [facility];  Service: Endoscopy;  Laterality: N/A;  ERCP SCOPE   UPPER GASTROINTESTINAL ENDOSCOPY          Social History Pilar Plate  [place]  reports that he quit smoking about 13 years ago. His smoking use included cigarettes. He has never been exposed to tobacco smoke. He has never used smokeless tobacco. He reports that he does not drink alcohol and does not use drugs.   family history includes Brain cancer in his paternal grandmother; Cancer in his mother; Colon cancer in his maternal grandfather; Colon polyps in his maternal grandfather and mother; Drug abuse in his mother; Familial polyposis in his mother; Liver cancer in his paternal grandmother; Lung cancer in his maternal grandmother; Other in his maternal grandfather and mother.         Allergies  Allergen Reactions   Ondansetron Anaphylaxis and Rash      Other reaction(s): Headache Other reaction(s): Other, Other (See Comments) Headache Other reaction(s): Headache,Migraine Other reaction(s): Headache,Migraine Other reaction(s): Headache,Migraine Headache Headache Other reaction(s): Headache,Migraine Headache   Ondansetron Hcl Other (See Comments)      Severe headache          PHYSICAL EXAMINATION: Vital signs: Ht '6\' 6"'$  (1.981 m)   Wt 259 lb (117.5 kg)   BMI 29.93 kg/m   Constitutional: generally well-appearing, no acute distress Psychiatric: alert and oriented x3, cooperative Eyes: extraocular movements intact, anicteric, conjunctiva pink Mouth: oral pharynx moist, no lesions Neck: supple no lymphadenopathy Cardiovascular: heart regular rate and rhythm, no murmur Lungs: clear to auscultation bilaterally Abdomen: soft, firm tender mass right lower quadrant, no obvious ascites, no peritoneal signs, normal bowel sounds, no organomegaly Rectal: Omitted Extremities: no clubbing, cyanosis, or lower extremity edema bilaterally Skin: no lesions on visible extremities Neuro: No focal deficits.  Cranial nerves intact   ASSESSMENT:   1.  Recurrent anal fissure. 2.  Irregular bowel habits 3.  Mild elevation of bilirubin on 1 set of liver test.  Repeat liver tests normal.  Nothing further needs to be done 4.  History of ampullary adenoma status post endoscopic resection and multiple gastric polyps in a patient with Gardner syndrome.  Last surveillance exam December 2019 5.  Gardner syndrome status post total abdominal colectomy 6.  Desmoid tumor.  Ongoing 7.  Chronic pain related to his desmoid tumor   PLAN:   1.  Prescribe diltiazem 2% ointment.  Apply 5 times daily as instructed 2.  Citrucel 2 tablespoons daily in 14 ounces of water or juice 3.  Sitz baths once or twice daily 4.  Schedule EGD with side-viewing scope for upper GI surveillance in a  patient with Gardner syndrome and history of ampullary adenoma status post resection.  This will be performed at the hospital.The nature of the procedure, as well as the risks, benefits, and alternatives were carefully and thoroughly reviewed with the patient. Ample time for discussion and questions allowed. The patient understood, was satisfied, and agreed to proceed.  5.  Ongoing medical care with multiple other specialists.  Now for surveillance upper endoscopy.  No interval change in history or physical exam since the recent above outlined H&P.

## 2022-02-02 NOTE — Discharge Instructions (Signed)
YOU HAD AN ENDOSCOPIC PROCEDURE TODAY: Refer to the procedure report and other information in the discharge instructions given to you for any specific questions about what was found during the examination. If this information does not answer your questions, please call [place] office at [phone] to clarify.  ° °YOU SHOULD EXPECT: Some feelings of bloating in the abdomen. Passage of more gas than usual. Walking can help get rid of the air that was put into your GI tract during the procedure and reduce the bloating. If you had a lower endoscopy (such as a colonoscopy or flexible sigmoidoscopy) you may notice spotting of blood in your stool or on the toilet paper. Some abdominal soreness may be present for a day or two, also. ° °DIET: Your first meal following the procedure should be a light meal and then it is ok to progress to your normal diet. A half-sandwich or bowl of soup is an example of a good first meal. Heavy or fried foods are harder to digest and may make you feel nauseous or bloated. Drink plenty of fluids but you should avoid alcoholic beverages for 24 hours. If you had a esophageal dilation, please see attached instructions for diet.   ° °ACTIVITY: Your care partner should take you home directly after the procedure. You should plan to take it easy, moving slowly for the rest of the day. You can resume normal activity the day after the procedure however YOU SHOULD NOT DRIVE, use power tools, machinery or perform tasks that involve climbing or major physical exertion for 24 hours (because of the sedation medicines used during the test).  ° °SYMPTOMS TO REPORT IMMEDIATELY: °A gastroenterologist can be reached at any hour. Please call [phone]  for any of the following symptoms:  °Following lower endoscopy (colonoscopy, flexible sigmoidoscopy) °Excessive amounts of blood in the stool  °Significant tenderness, worsening of abdominal pains  °Swelling of the abdomen that is new, acute  °Fever of 100° or  higher  °Following upper endoscopy (EGD, EUS, ERCP, esophageal dilation) °Vomiting of blood or coffee ground material  °New, significant abdominal pain  °New, significant chest pain or pain under the shoulder blades  °Painful or persistently difficult swallowing  °New shortness of breath  °Black, tarry-looking or red, bloody stools ° °FOLLOW UP:  °If any biopsies were taken you will be contacted by phone or by letter within the next 1-3 weeks. Call [phone]  if you have not heard about the biopsies in 3 weeks.  °Please also call with any specific questions about appointments or follow up tests. ° °

## 2022-02-02 NOTE — Transfer of Care (Signed)
Immediate Anesthesia Transfer of Care Note  Patient: Jonathan Marsh  Procedure(s) Performed: ESOPHAGOGASTRODUODENOSCOPY (EGD) WITH PROPOFOL  Patient Location: PACU and Endoscopy Unit  Anesthesia Type:MAC  Level of Consciousness: awake, alert  and oriented  Airway & Oxygen Therapy: Patient Spontanous Breathing and Patient connected to face mask oxygen  Post-op Assessment: Report given to RN and Post -op Vital signs reviewed and stable  Post vital signs: Reviewed and stable  Last Vitals:  Vitals Value Taken Time  BP 106/57 02/02/22 1107  Temp 36.3 C 02/02/22 1107  Pulse 60 02/02/22 1107  Resp 20 02/02/22 1107  SpO2 100 % 02/02/22 1107  Vitals shown include unvalidated device data.  Last Pain:  Vitals:   02/02/22 1107  TempSrc: Tympanic  PainSc: Asleep         Complications: No notable events documented.

## 2022-02-02 NOTE — Anesthesia Procedure Notes (Signed)
Procedure Name: MAC Date/Time: 02/02/2022 10:42 AM  Performed by: Maxwell Caul, CRNAPre-anesthesia Checklist: Patient identified, Emergency Drugs available, Suction available and Patient being monitored Oxygen Delivery Method: Simple face mask

## 2022-02-02 NOTE — Anesthesia Preprocedure Evaluation (Addendum)
Anesthesia Evaluation  Patient identified by MRN, date of birth, ID band Patient awake    Reviewed: Allergy & Precautions, NPO status , Patient's Chart, lab work & pertinent test results, reviewed documented beta blocker date and time   History of Anesthesia Complications (+) history of anesthetic complications (Aspiration)  Airway Mallampati: II  TM Distance: >3 FB Neck ROM: Full    Dental  (+) Teeth Intact, Dental Advisory Given   Pulmonary former smoker,    Pulmonary exam normal breath sounds clear to auscultation       Cardiovascular negative cardio ROS Normal cardiovascular exam Rhythm:Regular Rate:Normal     Neuro/Psych  Headaches, PSYCHIATRIC DISORDERS Anxiety    GI/Hepatic Neg liver ROS, GERD  ,Ampullary adenoma Gardner syndrome Desmoid tumor of abdomen   Endo/Other  negative endocrine ROS  Renal/GU negative Renal ROS     Musculoskeletal negative musculoskeletal ROS (+)   Abdominal   Peds  Hematology negative hematology ROS (+)   Anesthesia Other Findings Day of surgery medications reviewed with the patient.  Reproductive/Obstetrics                            Anesthesia Physical Anesthesia Plan  ASA: 2  Anesthesia Plan: MAC   Post-op Pain Management:    Induction: Intravenous  PONV Risk Score and Plan: 1 and TIVA and Treatment may vary due to age or medical condition  Airway Management Planned: Nasal Cannula and Natural Airway  Additional Equipment:   Intra-op Plan:   Post-operative Plan:   Informed Consent: I have reviewed the patients History and Physical, chart, labs and discussed the procedure including the risks, benefits and alternatives for the proposed anesthesia with the patient or authorized representative who has indicated his/her understanding and acceptance.     Dental advisory given  Plan Discussed with: CRNA and Anesthesiologist  Anesthesia  Plan Comments:         Anesthesia Quick Evaluation

## 2022-02-02 NOTE — Op Note (Signed)
[facility] Patient Name: Jonathan Marsh Procedure Date: 02/02/2022 MRN: [id number] Attending MD: Docia Chuck. Henrene Pastor , MD Date of Birth: [birth year] CSN: [id number] Age: 36 Admit Type: Outpatient Procedure:                Upper GI endoscopy Indications:              Surveillance for malignancy due to personal history                            of polyposis syndrome (Gardner syndrome with a                            history of ampullary adenoma status post endoscopic                            resection) Providers:                Docia Chuck. Henrene Pastor, MD, Jaci Carrel, RN, Cherylynn Ridges, Technician, Karis Juba, CRNA Referring MD:             Micheline Rough MD Medicines:                Monitored Anesthesia Care Complications:            No immediate complications. Estimated Blood Loss:     Estimated blood loss: none. Procedure:                Pre-Anesthesia Assessment:                           - Prior to the procedure, a History and Physical                            was performed, and patient medications and                            allergies were reviewed. The patient's tolerance of                            previous anesthesia was also reviewed. The risks                            and benefits of the procedure and the sedation                            options and risks were discussed with the patient.                            All questions were answered, and informed consent                            was obtained. Prior Anticoagulants: The patient has  taken no previous anticoagulant or antiplatelet                            agents. ASA Grade Assessment: III - A patient with                            severe systemic disease. After reviewing the risks                            and benefits, the patient was deemed in                            satisfactory condition to undergo the procedure.                            After obtaining informed consent, the endoscope was                            passed under direct vision. Throughout the                            procedure, the patient's blood pressure, pulse, and                            oxygen saturations were monitored continuously. The                            [id number]-Q190V ([id number]) Olympus duodenoscope was                            introduced through the mouth, and advanced to the                            second part of duodenum. The upper GI endoscopy was                            accomplished without difficulty. The patient                            tolerated the procedure well. Scope In: Scope Out: Findings:      The esophagus was not examined (using a side-viewing scope), though the       gastroesophageal junction was unremarkable.      The stomach revealed multiple diminutive polyps. Nothing dominant.       Nothing malignant appearing. See images.      The examined duodenum was normal. The area of prior ampullectomy was       unremarkable. See images      The cardia and gastric fundus were normal on retroflexion. Impression:               1. Multiple diminutive gastric polyps                           2. Unremarkable area of prior ampullectomy. No  duodenal polypoid lesions. Moderate Sedation:      none Recommendation:           - Patient has a contact number available for                            emergencies. The signs and symptoms of potential                            delayed complications were discussed with the                            patient. Return to normal activities tomorrow.                            Written discharge instructions were provided to the                            patient.                           - Resume previous diet.                           - Continue present medications.                           - Repeat upper endoscopy in 2 years for                             surveillance. Procedure Code(s):        --- Professional ---                           [phone], Esophagogastroduodenoscopy, flexible,                            transoral; diagnostic, including collection of                            specimen(s) by brushing or washing, when performed                            (separate procedure) Diagnosis Code(s):        --- Professional ---                           Z86.010, Personal history of colonic polyps CPT copyright 2019 [employer]. All rights reserved. The codes documented in this report are preliminary and upon coder review may  be revised to meet current compliance requirements. Docia Chuck. Henrene Pastor, MD 02/02/2022 11:16:15 AM This report has been signed electronically. Number of Addenda: 0

## 2022-03-03 ENCOUNTER — Other Ambulatory Visit: Payer: Self-pay | Admitting: Family Medicine

## 2022-03-07 MED ORDER — SILDENAFIL CITRATE 100 MG PO TABS: 100.0000 mg | ORAL_TABLET | ORAL | 0 refills | Status: DC | PRN

## 2022-03-13 ENCOUNTER — Telehealth: Payer: Self-pay | Admitting: Family Medicine

## 2022-03-13 NOTE — Telephone Encounter (Signed)
Left message for patient to call back and schedule Medicare Annual Wellness Visit (AWV) either virtually or in office. Left  my Jonathan Marsh number [phone]  awvi 02/11/22 per [place] please schedule at anytime with [facility] Nurse Health Advisor 1 or 2   This should be a 45 minute visit.

## 2022-03-17 ENCOUNTER — Encounter: Payer: Self-pay | Admitting: Internal Medicine

## 2022-03-17 ENCOUNTER — Other Ambulatory Visit: Payer: Self-pay

## 2022-03-17 MED ORDER — DILTIAZEM GEL 2 %: 1.0000 | Freq: Every day | CUTANEOUS | 2 refills | Status: DC

## 2022-03-17 MED ORDER — METRONIDAZOLE 500 MG PO TABS: 500.0000 mg | ORAL_TABLET | Freq: Three times a day (TID) | ORAL | 0 refills | Status: DC

## 2022-03-21 ENCOUNTER — Other Ambulatory Visit: Payer: Self-pay | Admitting: Family Medicine

## 2022-03-21 MED ORDER — SILDENAFIL CITRATE 100 MG PO TABS: 100.0000 mg | ORAL_TABLET | ORAL | 0 refills | Status: AC | PRN

## 2022-03-21 NOTE — Telephone Encounter (Signed)
Ok to give him 20 tablets for 1 month but he needs an appt to establish with me

## 2022-03-24 ENCOUNTER — Other Ambulatory Visit: Payer: Medicare Other

## 2022-03-24 DIAGNOSIS — Z01812 Encounter for preprocedural laboratory examination: Secondary | ICD-10-CM

## 2022-03-24 DIAGNOSIS — I471 Supraventricular tachycardia: Secondary | ICD-10-CM

## 2022-03-25 LAB — CBC
Hematocrit: 45.9 % (ref 37.5–51.0)
Hemoglobin: 15.8 g/dL (ref 13.0–17.7)
MCH: 31.3 pg (ref 26.6–33.0)
MCHC: 34.4 g/dL (ref 31.5–35.7)
MCV: 91 fL (ref 79–97)
Platelets: 330 10*3/uL (ref 150–450)
RBC: 5.05 x10E6/uL (ref 4.14–5.80)
RDW: 14.5 % (ref 11.6–15.4)
WBC: 8.6 10*3/uL (ref 3.4–10.8)

## 2022-03-25 LAB — BASIC METABOLIC PANEL
BUN/Creatinine Ratio: 7 — ABNORMAL LOW (ref 9–20)
BUN: 7 mg/dL (ref 6–20)
CO2: 24 mmol/L (ref 20–29)
Calcium: 9.9 mg/dL (ref 8.7–10.2)
Chloride: 101 mmol/L (ref 96–106)
Creatinine, Ser: 1.03 mg/dL (ref 0.76–1.27)
Glucose: 73 mg/dL (ref 70–99)
Potassium: 4.1 mmol/L (ref 3.5–5.2)
Sodium: 141 mmol/L (ref 134–144)
eGFR: 97 mL/min/{1.73_m2} (ref >59)

## 2022-03-27 ENCOUNTER — Other Ambulatory Visit: Payer: Self-pay | Admitting: Family Medicine

## 2022-03-30 ENCOUNTER — Encounter (HOSPITAL_COMMUNITY)
Admission: RE | Disposition: A | Discharge status: Home / Self Care | Payer: Medicare Other | Source: Home / Self Care | Attending: Cardiology

## 2022-03-30 ENCOUNTER — Ambulatory Visit (HOSPITAL_COMMUNITY): Payer: Medicare Other | Admitting: Anesthesiology

## 2022-03-30 ENCOUNTER — Ambulatory Visit (HOSPITAL_BASED_OUTPATIENT_CLINIC_OR_DEPARTMENT_OTHER): Payer: Medicare Other | Admitting: Anesthesiology

## 2022-03-30 ENCOUNTER — Other Ambulatory Visit: Payer: Self-pay

## 2022-03-30 ENCOUNTER — Ambulatory Visit (HOSPITAL_COMMUNITY)
Admission: RE | Admit: 2022-03-30 | Discharge: 2022-03-30 | Disposition: A | Discharge status: Home / Self Care | Payer: Medicare Other | Attending: Cardiology | Admitting: Cardiology

## 2022-03-30 DIAGNOSIS — F419 Anxiety disorder, unspecified: Secondary | ICD-10-CM | POA: Diagnosis not present

## 2022-03-30 DIAGNOSIS — I471 Supraventricular tachycardia: Secondary | ICD-10-CM

## 2022-03-30 HISTORY — PX: SVT ABLATION: EP1225

## 2022-03-30 SURGERY — SVT ABLATION
Anesthesia: Monitor Anesthesia Care

## 2022-03-30 MED ORDER — BUPIVACAINE HCL (PF) 0.25 % IJ SOLN
INTRAMUSCULAR | Status: DC | PRN
  Administered 2022-03-30: 30 mL

## 2022-03-30 MED ORDER — DEXAMETHASONE SODIUM PHOSPHATE 10 MG/ML IJ SOLN
INTRAMUSCULAR | Status: DC | PRN
  Administered 2022-03-30 (×5): 2 mg via INTRAVENOUS

## 2022-03-30 MED ORDER — HEPARIN (PORCINE) IN NACL 1000-0.9 UT/500ML-% IV SOLN
INTRAVENOUS | Status: DC | PRN
  Administered 2022-03-30: 500 mL

## 2022-03-30 MED ORDER — PHENYLEPHRINE 80 MCG/ML (10ML) SYRINGE FOR IV PUSH (FOR BLOOD PRESSURE SUPPORT)
PREFILLED_SYRINGE | INTRAVENOUS | Status: DC | PRN
  Administered 2022-03-30 (×6): 80 ug via INTRAVENOUS

## 2022-03-30 MED ORDER — SODIUM CHLORIDE 0.9 % IV SOLN: INTRAVENOUS | Status: DC

## 2022-03-30 MED ORDER — ACETAMINOPHEN 325 MG PO TABS: 650.0000 mg | ORAL_TABLET | ORAL | Status: DC | PRN

## 2022-03-30 MED ORDER — MIDAZOLAM HCL 2 MG/2ML IJ SOLN
INTRAMUSCULAR | Status: DC | PRN
  Administered 2022-03-30: 2 mg via INTRAVENOUS

## 2022-03-30 MED ORDER — SODIUM CHLORIDE 0.9% FLUSH: 3.0000 mL | INTRAVENOUS | Status: DC | PRN

## 2022-03-30 MED ORDER — ISOPROTERENOL HCL 0.2 MG/ML IJ SOLN
INTRAMUSCULAR | Status: AC
  Filled 2022-03-30: qty 5

## 2022-03-30 MED ORDER — ISOPROTERENOL HCL 0.2 MG/ML IJ SOLN
INTRAVENOUS | Status: DC | PRN
  Administered 2022-03-30: 2 ug/min via INTRAVENOUS

## 2022-03-30 MED ORDER — BUPIVACAINE HCL (PF) 0.25 % IJ SOLN
INTRAMUSCULAR | Status: AC
  Filled 2022-03-30: qty 30

## 2022-03-30 MED ORDER — LIDOCAINE 2% (20 MG/ML) 5 ML SYRINGE
INTRAMUSCULAR | Status: DC | PRN
  Administered 2022-03-30 (×2): 20 mg via INTRAVENOUS

## 2022-03-30 MED ORDER — PROPOFOL 500 MG/50ML IV EMUL
INTRAVENOUS | Status: DC | PRN
  Administered 2022-03-30: 75 ug/kg/min via INTRAVENOUS

## 2022-03-30 MED ORDER — SODIUM CHLORIDE 0.9 % IV SOLN: 250.0000 mL | INTRAVENOUS | Status: DC | PRN

## 2022-03-30 MED ORDER — FENTANYL CITRATE (PF) 250 MCG/5ML IJ SOLN
INTRAMUSCULAR | Status: DC | PRN
  Administered 2022-03-30 (×4): 25 ug via INTRAVENOUS

## 2022-03-30 SURGICAL SUPPLY — 12 items
CATH EZ STEER NAV 4MM D-F CUR (ABLATOR) IMPLANT
CATH JOSEPH QUAD ALLRED 6F REP (CATHETERS) IMPLANT
CATH WEB BI DIR CSDF CRV REPRO (CATHETERS) IMPLANT
CLOSURE PERCLOSE PROSTYLE (VASCULAR PRODUCTS) IMPLANT
PACK EP LATEX FREE (CUSTOM PROCEDURE TRAY) ×1
PACK EP LF (CUSTOM PROCEDURE TRAY) ×1 IMPLANT
PAD DEFIB RADIO PHYSIO CONN (PAD) ×1 IMPLANT
PATCH CARTO3 (PAD) IMPLANT
SHEATH PINNACLE 6F 10CM (SHEATH) IMPLANT
SHEATH PINNACLE 7F 10CM (SHEATH) IMPLANT
SHEATH PINNACLE 8F 10CM (SHEATH) IMPLANT
SHEATH PROBE COVER 6X72 (BAG) IMPLANT

## 2022-03-30 NOTE — Progress Notes (Signed)
Dr Curt Bears notified of client on antibiotic and in to see client

## 2022-03-30 NOTE — Transfer of Care (Signed)
Immediate Anesthesia Transfer of Care Note  Patient: Jonathan Marsh  Procedure(s) Performed: SVT ABLATION  Patient Location: Cath Lab  Anesthesia Type:MAC  Level of Consciousness: awake, alert  and oriented  Airway & Oxygen Therapy: Patient Spontanous Breathing  Post-op Assessment: Post -op Vital signs reviewed and stable  Post vital signs: stable  Last Vitals:  Vitals Value Taken Time  BP    Temp    Pulse    Resp    SpO2      Last Pain:  Vitals:   03/30/22 1220  TempSrc: Temporal  PainSc:          Complications: There were no known notable events for this encounter.

## 2022-03-30 NOTE — H&P (Signed)
Electrophysiology Office Note   Date:  03/30/2022   ID:  Jonathan Marsh, DOB 1986-03-19, MRN [id number]  PCP:  Farrel Conners, MD  Cardiologist:  Irish Lack Primary Electrophysiologist:  Lorma Heater Meredith Leeds, MD    Chief Complaint: palpitations   History of Present Illness: Jonathan Marsh is a 36 y.o. male who is being seen today for the evaluation of palpitations at the request of No ref. provider found. Presenting today for electrophysiology evaluation.  He has a history of Gardner syndrome, FAP.  He had a colectomy at age 32.  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  He was treated with chemotherapy.  Tumor size increased.  He was put on a clinical trial and got placebo therapy.  The tumor shrunk with placebo therapy.  His last chemotherapy was January 2022.  He did have a reduction in his ejection fraction.  Most recent echo shows an ejection fraction of 50 to 55%.    Since January 2023, he has had multiple episodes of dizziness and lightheadedness.  This occurs mainly when he stands up or is moving around.  His heart rates for the patient to get up into the 150s to 180s.  He wore a cardiac monitor that showed both sinus tachycardia and possible episodes of SVT.  Today, denies symptoms of palpitations, chest pain, shortness of breath, orthopnea, PND, lower extremity edema, claudication, dizziness, presyncope, syncope, bleeding, or neurologic sequela. The patient is tolerating medications without difficulties. Plan SVT ablation today.    Past Medical History:  Diagnosis Date   Ampullary adenoma    Complication of anesthesia    aspirated under anesthesia   Desmoid tumor of abdomen    Gardner syndrome    Gastric polyp    GERD (gastroesophageal reflux disease)    Headache    mild - stress related   Multiple gastric polyps    Polyp of duodenum    Past Surgical History:  Procedure Laterality Date   APPENDECTOMY     COLECTOMY     age 21 due to gardners syndrome-  has j pouch reconstruction   COLONOSCOPY     ESOPHAGOGASTRODUODENOSCOPY N/A 03/30/2015   Procedure: ESOPHAGOGASTRODUODENOSCOPY (EGD);  Surgeon: Irene Shipper, MD;  Location: [facility];  Service: Endoscopy;  Laterality: N/A;  need side viewing scope   ESOPHAGOGASTRODUODENOSCOPY N/A 01/18/2016   Procedure: ESOPHAGOGASTRODUODENOSCOPY (EGD);  Surgeon: Irene Shipper, MD;  Location: [facility];  Service: Endoscopy;  Laterality: N/A;   use ercp scope    ESOPHAGOGASTRODUODENOSCOPY (EGD) WITH PROPOFOL N/A 07/19/2018   Procedure: ESOPHAGOGASTRODUODENOSCOPY (EGD) WITH PROPOFOL;  Surgeon: Irene Shipper, MD;  Location: [facility];  Service: Endoscopy;  Laterality: N/A;  ERCP SCOPE   ESOPHAGOGASTRODUODENOSCOPY (EGD) WITH PROPOFOL N/A 02/02/2022   Procedure: ESOPHAGOGASTRODUODENOSCOPY (EGD) WITH PROPOFOL;  Surgeon: Irene Shipper, MD;  Location: [facility];  Service: Gastroenterology;  Laterality: N/A;  using ERCP scope   UPPER GASTROINTESTINAL ENDOSCOPY       Current Facility-Administered Medications  Medication Dose Route Frequency Provider Last Rate Last Admin   0.9 %  sodium chloride infusion   Intravenous Continuous Remedy Corporan, Ocie Doyne, MD        Allergies:   Zofran [ondansetron]   Social History:  The patient  reports that he quit smoking about 14 years ago. His smoking use included cigarettes. He has never been exposed to tobacco smoke. He has never used smokeless tobacco. He reports that he does not drink alcohol and does not use  drugs.   Family History:  The patient's family history includes Brain cancer in his paternal grandmother; Cancer in his mother; Colon cancer in his maternal grandfather; Colon polyps in his maternal grandfather and mother; Drug abuse in his mother; Familial polyposis in his mother; Liver cancer in his paternal grandmother; Lung cancer in his maternal grandmother; Other in his maternal grandfather and mother.   ROS:  Please see the history of present illness.   Otherwise,  review of systems is positive for none.   All other systems are reviewed and negative.   PHYSICAL EXAM: VS:  There were no vitals taken for this visit. , BMI There is no height or weight on file to calculate BMI. GEN: Well nourished, well developed, in no acute distress  HEENT: normal  Neck: no JVD, carotid bruits, or masses Cardiac: RRR; no murmurs, rubs, or gallops,no edema  Respiratory:  clear to auscultation bilaterally, normal work of breathing GI: soft, nontender, nondistended, + BS MS: no deformity or atrophy  Skin: warm and dry Neuro:  Strength and sensation are intact Psych: euthymic mood, full affect  Recent Labs: 10/26/2021: TSH 2.82 11/16/2021: ALT 18 03/24/2022: BUN 7; Creatinine, Ser 1.03; Hemoglobin 15.8; Platelets 330; Potassium 4.1; Sodium 141    Lipid Panel     Component Value Date/Time   CHOL 181 10/26/2021 0950   TRIG 195.0 (H) 10/26/2021 0950   HDL 41.80 10/26/2021 0950   CHOLHDL 4 10/26/2021 0950   VLDL 39.0 10/26/2021 0950   LDLCALC 100 (H) 10/26/2021 0950     Wt Readings from Last 3 Encounters:  02/02/22 108.9 kg  12/15/21 117.5 kg  12/12/21 118.4 kg      Other studies Reviewed: Additional studies/ records that were reviewed today include: TTE 11/15/21  Review of the above records today demonstrates:    1. Left ventricular ejection fraction, by estimation, is 50 to 55%. The  left ventricle has low normal function. The left ventricle has no regional  wall motion abnormalities. Left ventricular diastolic parameters are  consistent with Grade I diastolic  dysfunction (impaired relaxation).   2. Right ventricular systolic function is normal. The right ventricular  size is mildly enlarged. Tricuspid regurgitation signal is inadequate for  assessing PA pressure.   3. The mitral valve is normal in structure. Trivial mitral valve  regurgitation.   4. The aortic valve is tricuspid. Aortic valve regurgitation is not  visualized. No aortic stenosis is  present.   5. Aortic dilatation noted. There is mild dilatation of the aortic root,  measuring 40 mm.   Cardiac monitor 10/11/21 personally reviewed Sinus rhythm including NSR, sinus tachycardia, sinus bradycardia Frequent episodes of SVT - HR as high as 150s-160   ASSESSMENT AND PLAN:  1.  Palpitations with possible SVT: Harrell Niehoff has presented today for surgery, with the diagnosis of SVT.  The various methods of treatment have been discussed with the patient and family. After consideration of risks, benefits and other options for treatment, the patient has consented to  Procedure(s): Catheter ablation as a surgical intervention .  Risks include but not limited to complete heart block, stroke, esophageal damage, nerve damage, bleeding, vascular damage, tamponade, perforation, MI, and death. The patient's history has been reviewed, patient examined, no change in status, stable for surgery.  I have reviewed the patient's chart and labs.  Questions were answered to the patient's satisfaction.    Chiara Coltrin Curt Bears, MD 03/30/2022 11:54 AM

## 2022-03-30 NOTE — Discharge Instructions (Signed)
Post procedure care instructions No driving for 4 days. No lifting over 5 lbs for 1 week. No vigorous or sexual activity for 1 week. You may return to work/your usual activities on 04/07/22. Keep procedure site clean & dry. If you notice increased pain, swelling, bleeding or pus, call/return!  You may shower after 24 hours, but no soaking in baths/hot tubs/pools for 1 week.

## 2022-03-30 NOTE — Anesthesia Preprocedure Evaluation (Addendum)
Anesthesia Evaluation  Patient identified by MRN, date of birth, ID band Patient awake    Reviewed: Allergy & Precautions, NPO status , Patient's Chart, lab work & pertinent test results  History of Anesthesia Complications (+) history of anesthetic complications (Aspiration)  Airway Mallampati: II  TM Distance: >3 FB Neck ROM: Full    Dental  (+) Missing   Pulmonary former smoker,    Pulmonary exam normal        Cardiovascular negative cardio ROS Normal cardiovascular exam     Neuro/Psych  Headaches, PSYCHIATRIC DISORDERS Anxiety    GI/Hepatic negative GI ROS, (+)     substance abuse  ,   Endo/Other  negative endocrine ROS  Renal/GU negative Renal ROS     Musculoskeletal negative musculoskeletal ROS (+) narcotic dependent  Abdominal   Peds  Hematology negative hematology ROS (+)   Anesthesia Other Findings SVT  Reproductive/Obstetrics                             Anesthesia Physical Anesthesia Plan  ASA: 2  Anesthesia Plan: MAC   Post-op Pain Management:    Induction: Intravenous  PONV Risk Score and Plan: 1 and Dexamethasone, Propofol infusion, Midazolam and Treatment may vary due to age or medical condition  Airway Management Planned: Simple Face Mask  Additional Equipment:   Intra-op Plan:   Post-operative Plan:   Informed Consent: I have reviewed the patients History and Physical, chart, labs and discussed the procedure including the risks, benefits and alternatives for the proposed anesthesia with the patient or authorized representative who has indicated his/her understanding and acceptance.     Dental advisory given  Plan Discussed with: CRNA  Anesthesia Plan Comments:        Anesthesia Quick Evaluation

## 2022-03-30 NOTE — Progress Notes (Signed)
Per Dr. Curt Bears pt may be discharged 2 hours early. Sites are clean dry and intact with no evidence of bleeding and no pain at either site.

## 2022-03-31 ENCOUNTER — Encounter (HOSPITAL_COMMUNITY): Payer: Self-pay | Admitting: Cardiology

## 2022-03-31 NOTE — Anesthesia Postprocedure Evaluation (Signed)
Anesthesia Post Note  Patient: Jonathan Marsh  Procedure(s) Performed: SVT ABLATION     Patient location during evaluation: PACU Anesthesia Type: MAC Level of consciousness: awake and alert Pain management: pain level controlled Vital Signs Assessment: post-procedure vital signs reviewed and stable Respiratory status: spontaneous breathing, nonlabored ventilation, respiratory function stable and patient connected to nasal cannula oxygen Cardiovascular status: stable and blood pressure returned to baseline Postop Assessment: no apparent nausea or vomiting Anesthetic complications: no   There were no known notable events for this encounter.  Last Vitals:  Vitals:   03/30/22 1501 03/30/22 1515  BP: 112/73 111/70  Pulse: 64 (!) 55  Resp: 13 14  Temp:    SpO2: 95% 97%    Last Pain:  Vitals:   03/30/22 1450  TempSrc:   PainSc: 5                  Delsie Amador L Lewis Grivas

## 2022-04-12 ENCOUNTER — Ambulatory Visit: Payer: Medicare Other

## 2022-04-12 ENCOUNTER — Telehealth: Payer: Self-pay

## 2022-04-12 NOTE — Telephone Encounter (Signed)
Contacted patient on preferred number listed in notes for scheduled AWV. Patient stated unable to complete visit due to another appointment will call back to reschedule.

## 2022-04-18 ENCOUNTER — Telehealth: Payer: Self-pay | Admitting: Family Medicine

## 2022-04-18 NOTE — Telephone Encounter (Signed)
Left message for patient to call back and schedule Medicare Annual Wellness Visit (AWV) either virtually or in office. Left  my Jonathan Marsh number [phone]   awvi 02/11/22 per [facility]  please schedule at anytime with [facility] Nurse Health Advisor 1 or 2   This should be a 45 minute visit.

## 2022-04-19 ENCOUNTER — Ambulatory Visit: Payer: Medicare Other

## 2022-04-19 ENCOUNTER — Telehealth: Payer: Self-pay

## 2022-04-19 NOTE — Telephone Encounter (Signed)
Unsuccessful attempt to reach patient on preferred number listed in notes for scheduled AWV. Left message on voicemail okay to reschedule. 

## 2022-04-28 ENCOUNTER — Ambulatory Visit: Payer: Medicare Other

## 2022-04-28 ENCOUNTER — Telehealth: Payer: Self-pay

## 2022-04-28 NOTE — Telephone Encounter (Signed)
Unsuccessful attempt to reach patient on preferred number listed in notes for scheduled AWV. Left message on voicemail okay to reschedule. 

## 2022-05-01 ENCOUNTER — Ambulatory Visit: Payer: Medicare Other | Admitting: Cardiology

## 2022-05-12 ENCOUNTER — Telehealth: Payer: Self-pay | Admitting: Family Medicine

## 2022-05-12 NOTE — Telephone Encounter (Signed)
Left message for patient to call back and schedule Medicare Annual Wellness Visit (AWV) either virtually or in office. Left  my jabber number [phone]  awvi 02/11/22 per [facility]  please schedule with Nurse Health Adviser   45 min for awv-i and in office appointments 30 min for awv-s  phone/virtual appointments  

## 2022-05-22 NOTE — Progress Notes (Unsigned)
PCP:  Farrel Conners, MD Primary Cardiologist: Larae Grooms, MD Electrophysiologist: Will Meredith Leeds, MD   Jonathan Marsh is a 36 y.o. male seen today for Will Meredith Leeds, MD for routine electrophysiology followup.   Pt underwent SVT ablation 03/30/2022 which showed NSR without inducible arrhyhtmia.  Since last being seen in our clinic the patient reports doing OK. He has restarted Chemotherapy with Doxorubicin. He has received more than the recommended lifetime max, but it thus far has been the only thing his tumors have responded to.  He has + orthostatic symptoms, which occurred last time he was on chemotherapy. Has previously had good results with midodrine and is to ask Oncologist if continues. He has not needed lopressor or had recurrent "SVT"  Past Medical History:  Diagnosis Date   Ampullary adenoma    Complication of anesthesia    aspirated under anesthesia   Desmoid tumor of abdomen    Gardner syndrome    Gastric polyp    GERD (gastroesophageal reflux disease)    Headache    mild - stress related   Multiple gastric polyps    Polyp of duodenum    Past Surgical History:  Procedure Laterality Date   APPENDECTOMY     COLECTOMY     age 76 due to gardners syndrome- has j pouch reconstruction   COLONOSCOPY     ESOPHAGOGASTRODUODENOSCOPY N/A 03/30/2015   Procedure: ESOPHAGOGASTRODUODENOSCOPY (EGD);  Surgeon: Irene Shipper, MD;  Location: [facility];  Service: Endoscopy;  Laterality: N/A;  need side viewing scope   ESOPHAGOGASTRODUODENOSCOPY N/A 01/18/2016   Procedure: ESOPHAGOGASTRODUODENOSCOPY (EGD);  Surgeon: Irene Shipper, MD;  Location: [facility];  Service: Endoscopy;  Laterality: N/A;   use ercp scope    ESOPHAGOGASTRODUODENOSCOPY (EGD) WITH PROPOFOL N/A 07/19/2018   Procedure: ESOPHAGOGASTRODUODENOSCOPY (EGD) WITH PROPOFOL;  Surgeon: Irene Shipper, MD;  Location: [facility];  Service: Endoscopy;  Laterality: N/A;  ERCP SCOPE   ESOPHAGOGASTRODUODENOSCOPY  (EGD) WITH PROPOFOL N/A 02/02/2022   Procedure: ESOPHAGOGASTRODUODENOSCOPY (EGD) WITH PROPOFOL;  Surgeon: Irene Shipper, MD;  Location: [facility];  Service: Gastroenterology;  Laterality: N/A;  using ERCP scope   SVT ABLATION N/A 03/30/2022   Procedure: SVT ABLATION;  Surgeon: Constance Haw, MD;  Location: [facility];  Service: Cardiovascular;  Laterality: N/A;   UPPER GASTROINTESTINAL ENDOSCOPY      Current Outpatient Medications  Medication Sig Dispense Refill   Alpha-Lipoic Acid 600 MG CAPS Take 600 mg by mouth in the morning.     ALPRAZolam (XANAX) 0.5 MG tablet Take 0.5 mg by mouth in the morning and at bedtime.     Cyanocobalamin (VITAMIN B-12 PO) Place 1,500 mcg under the tongue in the morning.     diltiazem 2 % GEL Apply 1 Application topically 5 (five) times daily. 30 g 2   fluconazole (DIFLUCAN) 150 MG tablet Take 1 tablet PO x1, repeat in 3 days 2 tablet 0   fluocinolone (SYNALAR) 0.01 % external solution Apply topically 2 (two) times daily. 60 mL 2   gabapentin (NEURONTIN) 300 MG capsule Take 600 mg by mouth in the morning, at noon, and at bedtime.     ketoconazole (NIZORAL) 2 % shampoo Apply 1 application. topically 2 (two) times a week. 120 mL 5   Melatonin 10 MG CAPS Take 10 mg by mouth at bedtime as needed (sleep).     methadone (DOLOPHINE) 10 MG tablet Take 10 mg by mouth 4 (four) times daily.  metoprolol tartrate (LOPRESSOR) 25 MG tablet Take 0.5 tablets (12.5 mg total) by mouth 2 (two) times daily as needed. (Patient taking differently: Take 12.5 mg by mouth 2 (two) times daily as needed (heart rate (tachycardia)).) 90 tablet 2   metroNIDAZOLE (FLAGYL) 500 MG tablet Take 1 tablet (500 mg total) by mouth 3 (three) times daily. 30 tablet 0   mirtazapine (REMERON) 30 MG tablet Take 30 mg by mouth at bedtime.     oxycodone (ROXICODONE) 30 MG immediate release tablet Take 30 mg by mouth every 3 (three) hours.     prochlorperazine (COMPAZINE) 10 MG tablet Take  10 tablets by mouth as needed.     sildenafil (VIAGRA) 100 MG tablet Take 1 tablet (100 mg total) by mouth as needed for erectile dysfunction. 20 tablet 0   tadalafil (CIALIS) 5 MG tablet Take 5 mg by mouth daily.     testosterone cypionate (DEPOTESTOSTERONE CYPIONATE) 200 MG/ML injection Inject 0.5 mLs into the skin once a week.     No current facility-administered medications for this visit.    Allergies  Allergen Reactions   Zofran [Ondansetron] Anaphylaxis and Rash     Headache,Migraine    Social History   Socioeconomic History   Marital status: Married    Spouse name: Not on file   Number of children: Not on file   Years of education: Not on file   Highest education level: Not on file  Occupational History   Not on file  Tobacco Use   Smoking status: Former    Types: Cigarettes    Quit date: 01/01/2008    Years since quitting: 14.4    Passive exposure: Never   Smokeless tobacco: Never  Vaping Use   Vaping Use: Never used  Substance and Sexual Activity   Alcohol use: No    Alcohol/week: 0.0 standard drinks of alcohol   Drug use: No   Sexual activity: Not on file  Other Topics Concern   Not on file  Social History Narrative   Work or School: 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 Last day of work was 11/16/16.       Home Situation: lives with wife      Spiritual Beliefs: none      Lifestyle: no regular exercise; diet is bad      Social Determinants of Radio broadcast assistant Strain: Not on file  Food Insecurity: Not on file  Transportation Needs: Not on file  Physical Activity: Not on file  Stress: Not on file  Social Connections: Not on file  Intimate Partner Violence: Not on file     Review of Systems: All other systems reviewed and are otherwise negative except as noted above.  Physical Exam: Vitals:   05/24/22 0914  BP: 100/68  Pulse: 89  SpO2: 99%  Weight: 260 lb 6.4 oz (118.1 kg)  Height: '6\' 6"'$  (1.981 m)     GEN- The patient is well appearing, alert and oriented x 3 today.   HEENT: normocephalic, atraumatic; sclera clear, conjunctiva pink; hearing intact; oropharynx clear; neck supple, no JVP Lymph- no cervical lymphadenopathy Lungs- Clear to ausculation bilaterally, normal work of breathing.  No wheezes, rales, rhonchi Heart- Regular rate and rhythm, no murmurs, rubs or gallops, PMI not laterally displaced GI- soft, non-tender, non-distended, bowel sounds present, no hepatosplenomegaly Extremities- No peripheral edema. no clubbing or cyanosis; DP/PT/radial pulses 2+ bilaterally MS- no significant deformity or atrophy Skin- warm and dry, no  rash or lesion Psych- euthymic mood, full affect Neuro- strength and sensation are intact  EKG is ordered. Personal review of EKG from today shows NSR at 89 bpm  Additional studies reviewed include: Previous EP office notes.   Cardiac monitor 10/11/21 personally reviewed Sinus rhythm including NSR, sinus tachycardia, sinus bradycardia Frequent episodes of SVT - HR as high as 150s-160  Assessment and Plan:  1. Palpitations  EPS 03/30/2022 without inducible arryhtmias Quiescent at this time  2. Orthostatic symptoms In setting of chemotherapy.  Is to follow up with Oncologist if doesn't improve, as has previously had good results on midodrine.   3. Doxorubicin use He is being monitored for potential cardiotoxicity at [facility].   Follow up with Dr. Curt Bears in 6 months, sooner with issues.   Shirley Friar, PA-C  05/24/22 9:28 AM

## 2022-05-24 ENCOUNTER — Ambulatory Visit: Payer: Medicare Other | Attending: Cardiology | Admitting: Student

## 2022-05-24 ENCOUNTER — Encounter: Payer: Self-pay | Admitting: Student

## 2022-05-24 VITALS — BP 100/68 | HR 89 | Ht 78.0 in | Wt 260.4 lb

## 2022-05-24 DIAGNOSIS — R42 Dizziness and giddiness: Secondary | ICD-10-CM

## 2022-05-24 DIAGNOSIS — R002 Palpitations: Secondary | ICD-10-CM | POA: Diagnosis not present

## 2022-05-24 DIAGNOSIS — I471 Supraventricular tachycardia, unspecified: Secondary | ICD-10-CM

## 2022-05-24 NOTE — Patient Instructions (Signed)
Medication Instructions:  Your physician recommends that you continue on your current medications as directed. Please refer to the Current Medication list given to you today.  *If you need a refill on your cardiac medications before your next appointment, please call your pharmacy*   Lab Work: None If you have labs (blood work) drawn today and your tests are completely normal, you will receive your results only by: MyChart Message (if you have MyChart) OR A paper copy in the mail If you have any lab test that is abnormal or we need to change your treatment, we will call you to review the results.   Follow-Up: At [facility], you and your health needs are our priority.  As part of our continuing mission to provide you with exceptional heart care, we have created designated Provider Care Teams.  These Care Teams include your primary Cardiologist (physician) and Advanced Practice Providers (APPs -  Physician Assistants and Nurse Practitioners) who all work together to provide you with the care you need, when you need it.  Your next appointment:   6 month(s)  The format for your next appointment:   In Person  Provider:   Will Camnitz, MD     Important Information About Sugar       

## 2022-05-31 ENCOUNTER — Telehealth: Payer: Self-pay | Admitting: *Deleted

## 2022-05-31 NOTE — Telephone Encounter (Signed)
Dr Irish Lack would like to see patient 3 months after EP does f/u visit post ablation.  Patient was seen by Oda Kilts, PA on 10/11.  I called patient to schedule follow up with Dr Irish Lack.  Left message to call office.

## 2022-06-03 IMAGING — DX DG FEMUR 2+V*L*
4 series · 4 of 4 positions shown · non-contrast
Comparison: None.

CLINICAL DATA: Mid left femoral pain

EXAM:
LEFT FEMUR 2 VIEWS

[femur ap (1 of 2)]
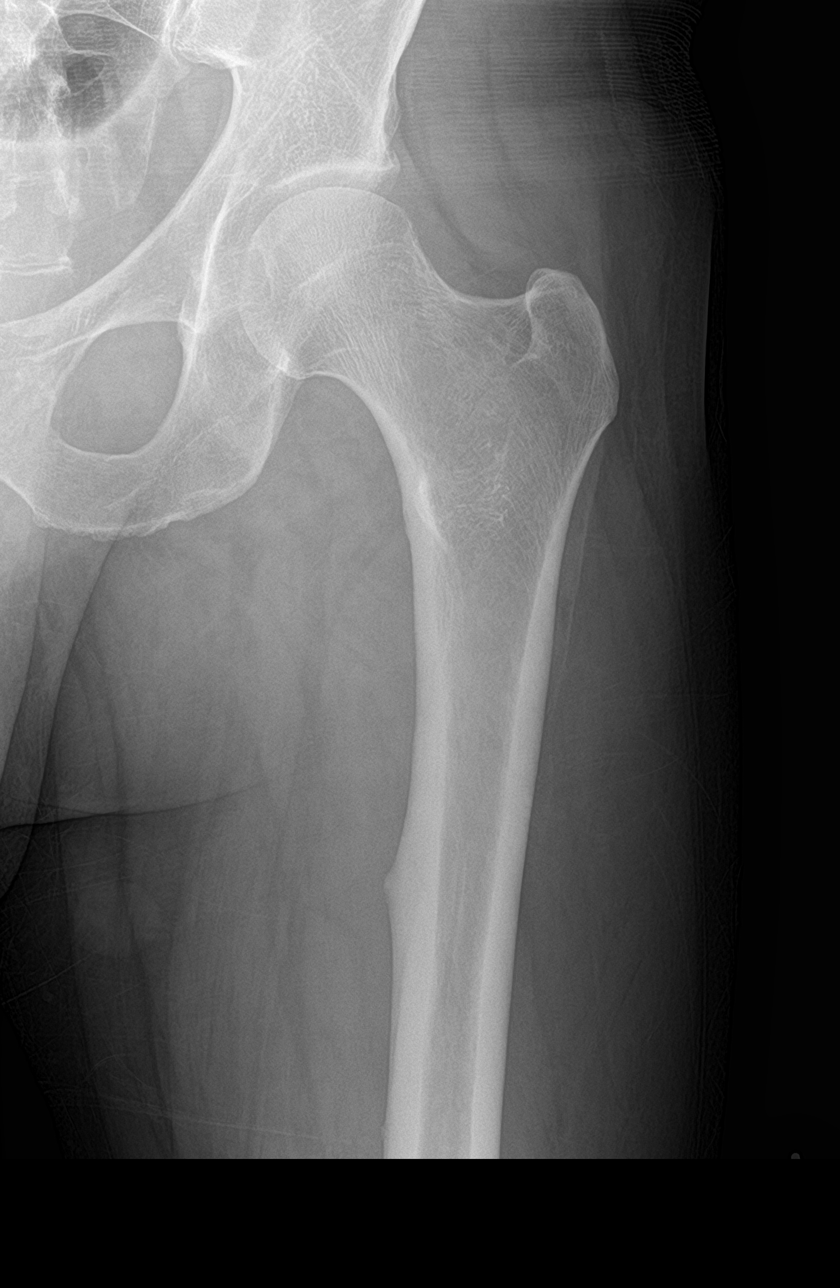

[femur ap (2 of 2)]
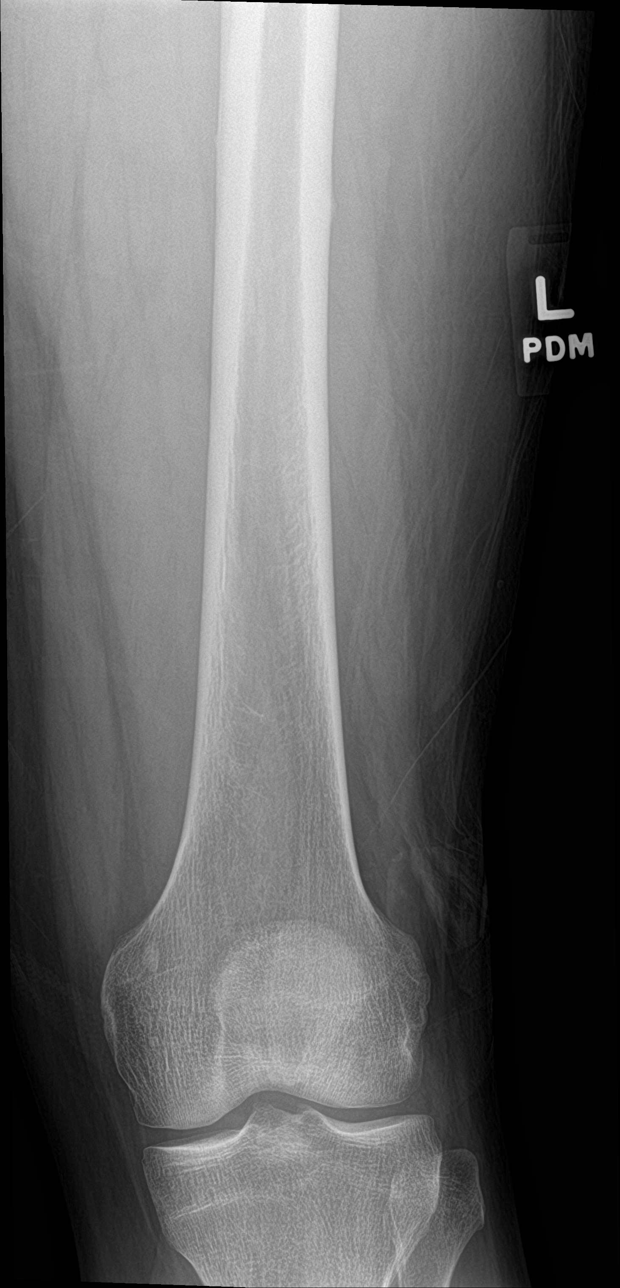

[femur lat (1 of 2)]
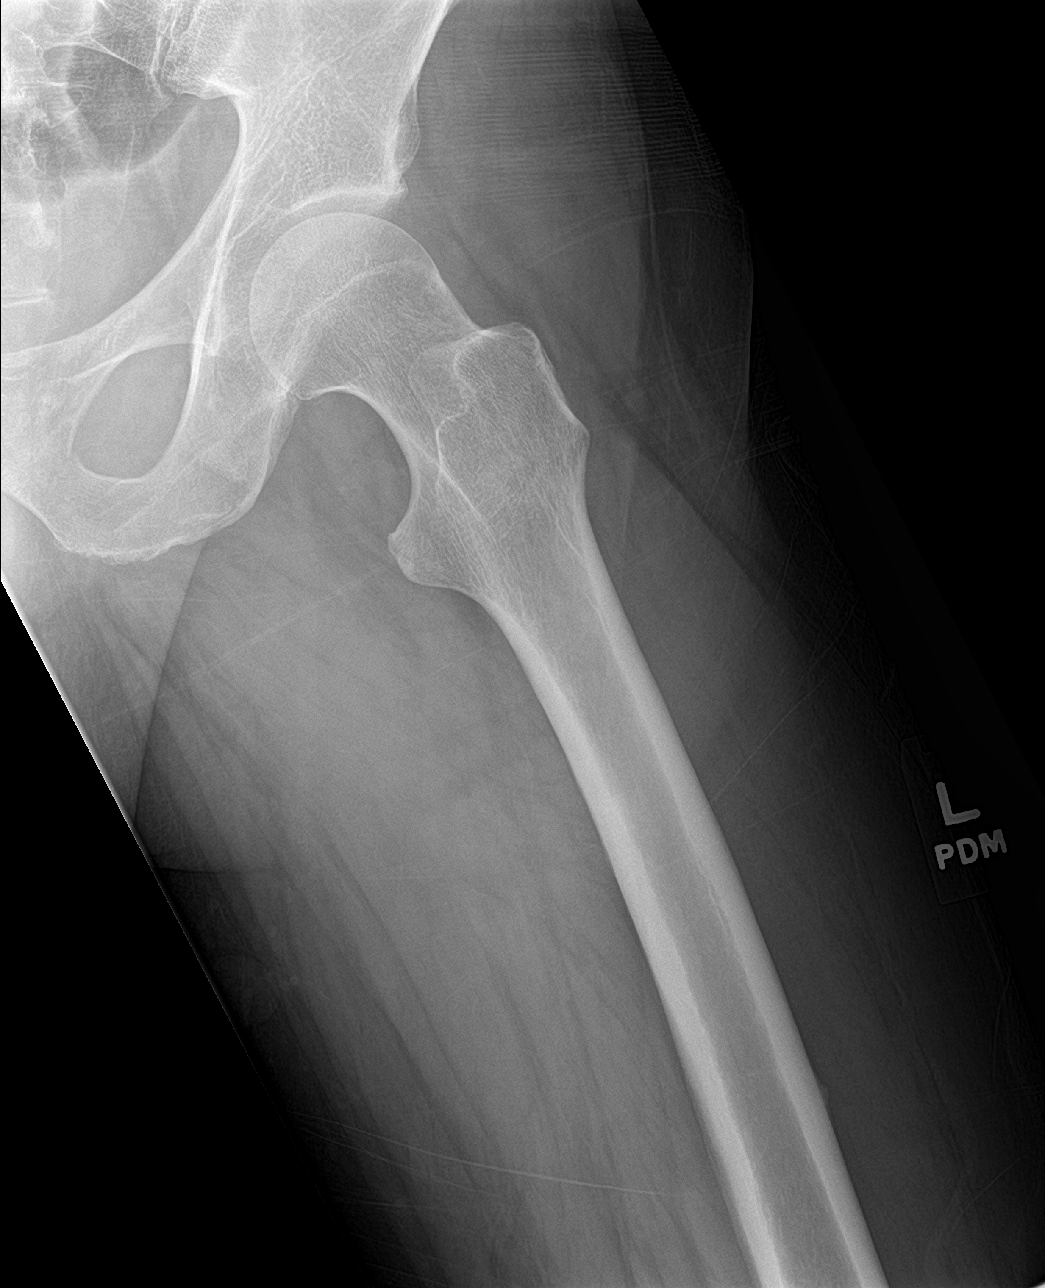

[femur lat (2 of 2)]
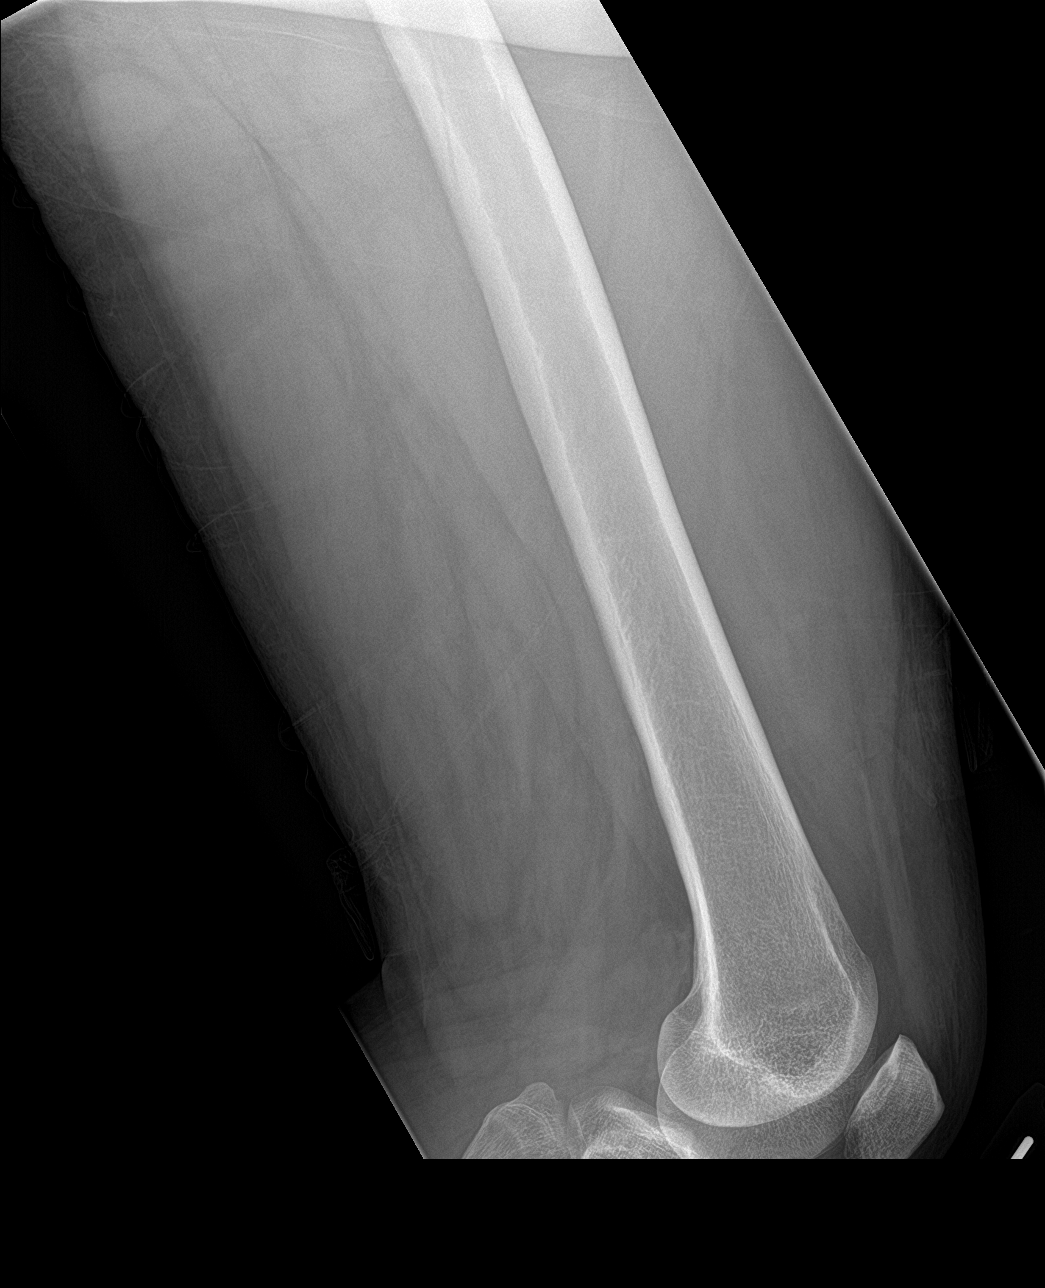

[4 of 4 positions shown; findings below may reference images not displayed]

FINDINGS: Frontal and lateral views of the left femur demonstrate no acute
fracture. Left hip and knee are well aligned. Joint spaces are well
preserved. Soft tissues are normal.
IMPRESSION: 1. Unremarkable left femur.

## 2022-06-06 NOTE — Telephone Encounter (Signed)
Left message to call office to schedule follow up with Dr Irish Lack

## 2022-06-13 NOTE — Telephone Encounter (Signed)
Recall placed

## 2022-06-21 ENCOUNTER — Telehealth: Payer: Self-pay | Admitting: Family Medicine

## 2022-06-21 NOTE — Telephone Encounter (Signed)
Left message for patient to call back and schedule Medicare Annual Wellness Visit (AWV) either virtually or in office. Left  my Jonathan Marsh number [phone]  awvi 02/11/22 [facility]  please schedule with Nurse Health Adviser   45 min for awv-i and in office appointments 30 min for awv-s  phone/virtual appointments

## 2022-07-26 ENCOUNTER — Telehealth: Payer: Self-pay | Admitting: Family Medicine

## 2022-07-26 NOTE — Telephone Encounter (Signed)
Left message for patient to call back and schedule Medicare Annual Wellness Visit (AWV) either virtually or in office. Left  my Jonathan Marsh number [phone]   awvi 02/11/22 per [facility]  please schedule with Nurse Health Adviser   45 min for awv-i and in office appointments 30 min for awv-s  phone/virtual appointments

## 2022-09-26 ENCOUNTER — Encounter: Payer: Self-pay | Admitting: Internal Medicine

## 2022-09-27 ENCOUNTER — Other Ambulatory Visit: Payer: Self-pay

## 2022-09-27 MED ORDER — METRONIDAZOLE 500 MG PO TABS: 500.0000 mg | ORAL_TABLET | Freq: Two times a day (BID) | ORAL | 0 refills | Status: DC

## 2022-09-27 MED ORDER — CIPROFLOXACIN HCL 500 MG PO TABS: 500.0000 mg | ORAL_TABLET | Freq: Two times a day (BID) | ORAL | 0 refills | Status: DC

## 2022-09-29 ENCOUNTER — Encounter (HOSPITAL_COMMUNITY): Payer: Self-pay | Admitting: Cardiology

## 2022-10-10 ENCOUNTER — Telehealth (INDEPENDENT_AMBULATORY_CARE_PROVIDER_SITE_OTHER): Payer: Medicare Other | Admitting: Family Medicine

## 2022-10-10 DIAGNOSIS — Z0000 Encounter for general adult medical examination without abnormal findings: Secondary | ICD-10-CM | POA: Diagnosis not present

## 2022-10-10 NOTE — Progress Notes (Signed)
PATIENT CHECK-IN and HEALTH RISK ASSESSMENT QUESTIONNAIRE:  -completed by phone/video for upcoming Medicare Preventive Visit  Pre-Visit Check-in: 1)Vitals (height, wt, BP, etc) - record in vitals section for visit on day of visit 2)Review and Update Medications, Allergies PMH, Surgeries, Social history in Epic 3)Hospitalizations in the last year with date/reason?   4)Review and Update Care Team (patient's specialists) in Epic 5) Complete PHQ9 in Epic  6) Complete Fall Screening in Epic 7)Review all Health Maintenance Due and order under PCP if not done.  Medicare Wellness Patient Questionnaire:  Answer theses question about your habits: Do you drink alcohol? no If yes, how many drinks do you have a day? Have you ever smoked?yes at teenage years for a month. Quit date if applicable?   How many packs a day do/did you smoke? N/a Do you use smokeless tobacco?no Do you use an illicit drugs?no Do you exercises? No - very limited due to current treatments/health He reports he has good support from friends Are you sexually active? Not currently Number of partners?one  Typical breakfast: dont eat breakfast Typical lunch: chicken, vegetable, tuna, fish- clean eating,low fat Typical dinner: no dinner Typical snacks: no  Beverages: body armor zero sugar, water  Answer theses question about you: Can you perform most household chores?no Do you find it hard to follow a conversation in a noisy room?no Do you often ask people to speak up or repeat themselves?no Do you feel that you have a problem with memory?yes Do you balance your checkbook and or bank acounts?yeah Do you feel safe at home?yes Last dentist visit? 4 year ago Do you need assistance with any of the following: Please note if so No  Driving?  Feeding yourself?  Getting from bed to chair?  Getting to the toilet?  Bathing or showering?  Dressing yourself?  Managing money?  Climbing a flight of stairs  Preparing  meals?    Do you have Advanced Directives in place (Living Will, Healthcare Power or Attorney)? No   Last eye Exam and location?3 years ago at [facility] in [place]   Do you currently use prescribed or non-prescribed narcotic or opioid pain medications?Yes  Do you have a history or close family history of breast, ovarian, tubal or peritoneal cancer or a family member with BRCA (breast cancer susceptibility 1 and 2) gene mutations? M grandmother-brain,lung. Liver; P grandmother had pancreatis.   Nurse/Assistant Credentials/time stamp: Karpuih Moyun/ CMA/12:42pm.    ----------------------------------------------------------------------------------------------------------------------------------------------------------------------------------------------------------------------    MEDICARE ANNUAL PREVENTIVE CARE VISIT WITH PROVIDER (Welcome to Medicare, initial annual wellness or annual wellness exam)  Virtual Visit via Video Note  I connected with Marisa Cyphers on 10/10/22  By a video enabled telemedicine application and verified that I am speaking with the correct person using two identifiers.  Location patient: home Location provider:work or home office Persons participating in the virtual visit: patient, provider  Concerns and/or follow up today: None as reports sees specialists at [facility] for his concerns. 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 Reports is tolerating the chemo. Limited in activity - can do minimal household chores. 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  Poor appetite so he eats one mean per day. Reports was told to try to eat snacks throughout the day like chips and m&Ms, but does not like to eat sugar and theses feel too heavy on his stomach.    See HM section in Epic for other details  of completed HM.     Patient-completed extensive health risk assessment - reviewed  and discussed with the patient: See Health Risk Assessment completed with patient prior to the visit either above or in recent phone note. This was reviewed in detailed with the patient today and appropriate recommendations, orders and referrals were placed as needed per Summary below and patient instructions.   Review of Medical History: -PMH, PSH, Family History and current specialty and care providers reviewed and updated and listed below   Patient Care Team: Farrel Conners, MD as PCP - General (Family Medicine) Jettie Booze, MD as PCP - Cardiology (Cardiology) Constance Haw, MD as PCP - Electrophysiology (Cardiology)   Past Medical History:  Diagnosis Date   Ampullary adenoma    Complication of anesthesia    aspirated under anesthesia   Desmoid tumor of abdomen    Gardner syndrome    Gastric polyp    GERD (gastroesophageal reflux disease)    Headache    mild - stress related   Multiple gastric polyps    Polyp of duodenum     Past Surgical History:  Procedure Laterality Date   APPENDECTOMY     COLECTOMY     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   COLONOSCOPY     ESOPHAGOGASTRODUODENOSCOPY N/A 03/30/2015   Procedure: ESOPHAGOGASTRODUODENOSCOPY (EGD);  Surgeon: Irene Shipper, MD;  Location: [facility];  Service: Endoscopy;  Laterality: N/A;  need side viewing scope   ESOPHAGOGASTRODUODENOSCOPY N/A 01/18/2016   Procedure: ESOPHAGOGASTRODUODENOSCOPY (EGD);  Surgeon: Irene Shipper, MD;  Location: [facility];  Service: Endoscopy;  Laterality: N/A;   use ercp scope    ESOPHAGOGASTRODUODENOSCOPY (EGD) WITH PROPOFOL N/A 07/19/2018   Procedure: ESOPHAGOGASTRODUODENOSCOPY (EGD) WITH PROPOFOL;  Surgeon: Irene Shipper, MD;  Location: [facility];  Service: Endoscopy;  Laterality: N/A;  ERCP SCOPE   ESOPHAGOGASTRODUODENOSCOPY (EGD) WITH PROPOFOL N/A 02/02/2022   Procedure: ESOPHAGOGASTRODUODENOSCOPY (EGD) WITH PROPOFOL;  Surgeon: Irene Shipper, MD;   Location: [facility];  Service: Gastroenterology;  Laterality: N/A;  using ERCP scope   SVT ABLATION N/A 03/30/2022   Procedure: SVT ABLATION;  Surgeon: Constance Haw, MD;  Location: [facility];  Service: Cardiovascular;  Laterality: N/A;   UPPER GASTROINTESTINAL ENDOSCOPY      Social History   Socioeconomic History   Marital status: Married    Spouse name: Not on file   Number of children: Not on file   Years of education: Not on file   Highest education level: Not on file  Occupational History   Not on file  Tobacco Use   Smoking status: Former    Types: Cigarettes    Quit date: 01/01/2008    Years since quitting: 14.7    Passive exposure: Never   Smokeless tobacco: Never  Vaping Use   Vaping Use: Never used  Substance and Sexual Activity   Alcohol use: No    Alcohol/week: 0.0 standard drinks of alcohol   Drug use: No   Sexual activity: Not on file  Other Topics Concern   Not on file  Social History Narrative   Work or School: 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 Last day of work was 11/16/16.       Home Situation: lives with wife      Spiritual Beliefs: none      Lifestyle: no regular exercise; diet is bad      Social Determinants  of Health   Financial Resource Strain: Not on file  Food Insecurity: Not on file  Transportation Needs: Not on file  Physical Activity: Not on file  Stress: Not on file  Social Connections: Not on file  Intimate Partner Violence: Not on file    Family History  Problem Relation Age of Onset   Cancer Mother        GI cancer   Drug abuse Mother    Colon polyps Mother    Other Mother        Gardners syndrome   Familial polyposis Mother    Lung cancer Maternal Grandmother    Colon cancer Maternal Grandfather    Colon polyps Maternal Grandfather    Other Maternal Grandfather        Gardners syndrome   Brain cancer Paternal Grandmother    Liver cancer Paternal Grandmother    Sleep  apnea Neg Hx     Current Outpatient Medications on File Prior to Visit  Medication Sig Dispense Refill   Alpha-Lipoic Acid 600 MG CAPS Take 600 mg by mouth in the morning.     ALPRAZolam (XANAX) 0.5 MG tablet Take 0.5 mg by mouth in the morning and at bedtime.     Cyanocobalamin (VITAMIN B-12 PO) Place 1,500 mcg under the tongue in the morning.     diltiazem 2 % GEL Apply 1 Application topically 5 (five) times daily. 30 g 2   fluocinolone (SYNALAR) 0.01 % external solution Apply topically 2 (two) times daily. 60 mL 2   gabapentin (NEURONTIN) 300 MG capsule Take 600 mg by mouth in the morning, at noon, and at bedtime.     ketoconazole (NIZORAL) 2 % shampoo Apply 1 application. topically 2 (two) times a week. 120 mL 5   Melatonin 10 MG CAPS Take 10 mg by mouth at bedtime as needed (sleep).     methadone (DOLOPHINE) 10 MG tablet Take 10 mg by mouth 4 (four) times daily.     metoprolol tartrate (LOPRESSOR) 25 MG tablet Take 0.5 tablets (12.5 mg total) by mouth 2 (two) times daily as needed. (Patient taking differently: Take 12.5 mg by mouth 2 (two) times daily as needed (heart rate (tachycardia)).) 90 tablet 2   mirtazapine (REMERON) 15 MG tablet Take 15 mg by mouth at bedtime.     naloxone (NARCAN) nasal spray 4 mg/0.1 mL Place into the nose.     oxycodone (ROXICODONE) 30 MG immediate release tablet Take 30 mg by mouth every 3 (three) hours.     pazopanib (VOTRIENT) 200 MG tablet Take by mouth.     sildenafil (VIAGRA) 100 MG tablet Take 1 tablet (100 mg total) by mouth as needed for erectile dysfunction. 20 tablet 0   tadalafil (CIALIS) 5 MG tablet Take 5 mg by mouth daily.     testosterone cypionate (DEPOTESTOSTERONE CYPIONATE) 200 MG/ML injection Inject 0.5 mLs into the skin once a week.     metroNIDAZOLE (FLAGYL) 500 MG tablet Take 1 tablet (500 mg total) by mouth 3 (three) times daily. (Patient not taking: Reported on 10/10/2022) 30 tablet 0   metroNIDAZOLE (FLAGYL) 500 MG tablet Take 1 tablet  (500 mg total) by mouth 2 (two) times daily. (Patient not taking: Reported on 10/10/2022) 14 tablet 0   prochlorperazine (COMPAZINE) 10 MG tablet Take 10 tablets by mouth as needed. (Patient not taking: Reported on 10/10/2022)     No current facility-administered medications on file prior to visit.    Allergies  Allergen Reactions  Zofran [Ondansetron] Anaphylaxis and Rash     Headache,Migraine       Physical Exam There were no vitals filed for this visit. Estimated body mass index is 30.09 kg/m as calculated from the following:   Height as of 05/24/22: '6\' 6"'$  (1.981 m).   Weight as of 05/24/22: 260 lb 6.4 oz (118.1 kg).  EKG (optional): deferred due to virtual visit  GENERAL: alert, oriented, no acute distress detected; full vision exam deferred due to pandemic and/or virtual encounter   HEENT: atraumatic, conjunttiva clear, no obvious abnormalities on inspection of external nose and ears  NECK: normal movements of the head and neck  LUNGS: on inspection no signs of respiratory distress, breathing rate appears normal, no obvious gross SOB, gasping or wheezing  CV: no obvious cyanosis  MS: moves all visible extremities without noticeable abnormality  PSYCH/NEURO: pleasant and cooperative, no obvious depression or anxiety, speech and thought processing grossly intact, Cognitive function grossly intact  Flowsheet Row Video Visit from 10/10/2022 in [facility] at [facility]  PHQ-9 Total Score 7           10/10/2022   12:30 PM 11/16/2021    1:55 PM 08/19/2021   11:00 AM  Depression screen PHQ 2/9  Decreased Interest '1 1 1  '$ Down, Depressed, Hopeless 0 2 0  PHQ - 2 Score '1 3 1  '$ Altered sleeping 0 3 1  Tired, decreased energy '3 3 2  '$ Change in appetite '3 1 3  '$ Feeling bad or failure about yourself  0 1 0  Trouble concentrating 0 0 1  Moving slowly or fidgety/restless 0 0 0  Suicidal thoughts 0 0 0  PHQ-9 Score '7 11 8  '$ Difficult doing work/chores Somewhat  difficult Very difficult   Reports the chemo makes him very tired and has loss of appetite due to chemo and feeling full.     03/30/2022   12:03 PM 10/10/2022   12:30 PM  Fall Risk  Falls in the past year?  0  Was there an injury with Fall?  0  Fall Risk Category Calculator  0  (RETIRED) Patient Fall Risk Level High fall risk   Patient at Risk for Falls Due to  No Fall Risks  Fall risk Follow up  Falls evaluation completed     SUMMARY AND PLAN:  Encounter for Medicare annual wellness exam   Discussed applicable health maintenance/preventive health measures and advised and referred or ordered per patient preferences:  Health Maintenance  Topic Date Due   DTaP/Tdap/Td (2 - Td or Tdap) 01/03/2021, advised, he can get at pharmacy if wishes as lives far from the office now   COVID-19 Vaccine (1) 10/26/2022 (Originally 05/13/1991), advised can get yearly at pharmacy   Hepatitis C Screening  11/17/2022 (Originally 05/14/2003)   Medicare Annual Wellness (AWV)  10/11/2023   INFLUENZA VACCINE  Completed   HIV Screening  Addressed   HPV VACCINES  Aged Out     Education and counseling on the following was provided based on the above review of health and a plan/checklist for the patient, along with additional information discussed, was provided for the patient in the patient instructions :  -Advised on importance of and resources for completing advanced directives - discussed at length, info provided in patient instructions, he plans to discuss at upcoming palliative care visit further -he has poor appetite and limited mobility at this time 2ndary to health challenges. We discussed powerful antioxidant foods that may help his body  during treatment and may increase energy/appetite - whole foods nutrient rich fruits and veggies he could eat for snacks around his other meal time foods.  -discussed chair exercises to keep extremities moving if unable to exercise on feet, this may help with the  fatigue some as well.    Follow up: see patient instructions   Patient Instructions  I really enjoyed getting to talk with you today! I am available on Tuesdays and Thursdays for virtual visits if you have any questions or concerns, or if I can be of any further assistance.   CHECKLIST FROM ANNUAL WELLNESS VISIT:  -Follow up (please call to schedule if not scheduled after visit):  -Inperson visit with your Primary Doctor office: -yearly for annual wellness visit with primary care office  Here is a list of your preventive care/health maintenance measures and the plan for each if any are due:  Health Maintenance  Topic Date Due   DTaP/Tdap/Td (2 - Td or Tdap) 01/03/2021   COVID-19 Vaccine (1) 10/26/2022 (Originally 05/13/1991)   Hepatitis C Screening  11/17/2022 (Originally 05/14/2003)   Medicare Annual Wellness (AWV)  10/11/2023   INFLUENZA VACCINE  Completed   HIV Screening  Addressed   HPV VACCINES  Aged Out    -See a dentist at least yearly  -Get your eyes checked and then per your eye specialist's recommendations  -Other issues addressed today:  -I have included below further information about nutrition, advance directives, etc. I hope you find this information useful.   -----------------------------------------------------------------------------------------------------------------------------------------------------------------------------------------------------------------------------------------------------------  NUTRITION: -eat colorful vegetables and fruits in small amounts throughout the day if you are able -Berries and greens such as kale, broccoli and collards are great choices. Fresh and steamed are best if able. -consume on a regular basis: whole grains (make sure first ingredient on label contains the word "whole"), fresh fruits, fish, nuts, seeds, healthy oils (such as olive oil, avocado oil, grape seed oil) -may eat small amounts of dairy and lean meat on  occasion, but avoid processed meats such as ham, bacon, lunch meat, etc. -drink water -try to avoid fast food and pre-packaged foods, processed meat -most experts advise limiting sodium to < '2300mg'$  per day -try to avoid foods that contain any ingredients with names you do not recognize  -try to avoid sugar/sweets (except for the natural sugar that occurs in fresh fruit) -try to avoid sweet drinks -try to avoid white rice, white bread, pasta (unless whole grain), white or yellow potatoes  EXERCISE: -try to move as much as you are able -if you wish to increase your physical activity, do so gradually and with the approval of your doctor -STOP and seek medical care immediately if you have any chest pain, chest discomfort or trouble breathing when starting or increasing exercise  -move and stretch your body, legs, feet and arms when sitting for long periods -consider moving hands, wrists, legs while sitting if able -consider chair exercise  STRESS MANAGEMENT: -can try meditating, or just sitting quietly with deep breathing while intentionally relaxing all parts of your body for 5 minutes daily -if you need further help with stress, anxiety or depression please follow up with your primary doctor or contact the wonderful folks at [facility]: [place]:  Everyone should have advanced health care directives in place. This is so that you get the care you want, should you ever be in a situation where you are unable to make your own medical decisions.   From the  [place] Advanced Directive Website: "[place] are legal documents in which you give written instructions about your health care if, in the future, you cannot speak for yourself.   A health care power of attorney allows you to name a person you trust to make your health care decisions if you cannot make them yourself. A declaration of a desire for a natural death (or living  will) is document, which states that you desire not to have your life prolonged by extraordinary measures if you have a terminal or incurable illness or if you are in a vegetative state. An advance instruction for mental health treatment makes a declaration of instructions, information and preferences regarding your mental health treatment. It also states that you are aware that the advance instruction authorizes a mental health treatment provider to act according to your wishes. It may also outline your consent or refusal of mental health treatment. A declaration of an anatomical gift allows anyone over the age of 11 to make a gift by will, organ donor card or other document."   Please see the following website or an elder law attorney for forms, FAQs and for completion of advanced directives: [place] Secretary of [place] ([email or web address])  Or copy and paste the following to your web browser: [email or web address]           Lucretia Kern, DO

## 2022-10-10 NOTE — Patient Instructions (Signed)
I really enjoyed getting to talk with you today! I am available on Tuesdays and Thursdays for virtual visits if you have any questions or concerns, or if I can be of any further assistance.   CHECKLIST FROM ANNUAL WELLNESS VISIT:  -Follow up (please call to schedule if not scheduled after visit):  -Inperson visit with your Primary Doctor office: -yearly for annual wellness visit with primary care office  Here is a list of your preventive care/health maintenance measures and the plan for each if any are due:  Health Maintenance  Topic Date Due   DTaP/Tdap/Td (2 - Td or Tdap) 01/03/2021   COVID-19 Vaccine (1) 10/26/2022 (Originally 05/13/1991)   Hepatitis C Screening  11/17/2022 (Originally 05/14/2003)   Medicare Annual Wellness (AWV)  10/11/2023   INFLUENZA VACCINE  Completed   HIV Screening  Addressed   HPV VACCINES  Aged Out    -See a dentist at least yearly  -Get your eyes checked and then per your eye specialist's recommendations  -Other issues addressed today:  -I have included below further information about nutrition, advance directives, etc. I hope you find this information useful.   -----------------------------------------------------------------------------------------------------------------------------------------------------------------------------------------------------------------------------------------------------------  NUTRITION: -eat colorful vegetables and fruits in small amounts throughout the day if you are able -Berries and greens such as kale, broccoli and collards are great choices. Fresh and steamed are best if able. -consume on a regular basis: whole grains (make sure first ingredient on label contains the word "whole"), fresh fruits, fish, nuts, seeds, healthy oils (such as olive oil, avocado oil, grape seed oil) -may eat small amounts of dairy and lean meat on occasion, but avoid processed meats such as ham, bacon, lunch meat, etc. -drink water -try  to avoid fast food and pre-packaged foods, processed meat -most experts advise limiting sodium to < '2300mg'$  per day -try to avoid foods that contain any ingredients with names you do not recognize  -try to avoid sugar/sweets (except for the natural sugar that occurs in fresh fruit) -try to avoid sweet drinks -try to avoid white rice, white bread, pasta (unless whole grain), white or yellow potatoes  EXERCISE: -try to move as much as you are able -if you wish to increase your physical activity, do so gradually and with the approval of your doctor -STOP and seek medical care immediately if you have any chest pain, chest discomfort or trouble breathing when starting or increasing exercise  -move and stretch your body, legs, feet and arms when sitting for long periods -consider moving hands, wrists, legs while sitting if able -consider chair exercise  STRESS MANAGEMENT: -can try meditating, or just sitting quietly with deep breathing while intentionally relaxing all parts of your body for 5 minutes daily -if you need further help with stress, anxiety or depression please follow up with your primary doctor or contact the wonderful folks at [place]: [place]:  Everyone should have advanced health care directives in place. This is so that you get the care you want, should you ever be in a situation where you are unable to make your own medical decisions.   From the [place] Advanced Directive Website: "[place] are legal documents in which you give written instructions about your health care if, in the future, you cannot speak for yourself.   A health care power of attorney allows you to name a person you trust to make your health care decisions if you cannot make them yourself. A declaration of a  desire for a natural death (or living will) is document, which states that you desire not to have your life prolonged by  extraordinary measures if you have a terminal or incurable illness or if you are in a vegetative state. An advance instruction for mental health treatment makes a declaration of instructions, information and preferences regarding your mental health treatment. It also states that you are aware that the advance instruction authorizes a mental health treatment provider to act according to your wishes. It may also outline your consent or refusal of mental health treatment. A declaration of an anatomical gift allows anyone over the age of 59 to make a gift by will, organ donor card or other document."   Please see the following website or an elder law attorney for forms, FAQs and for completion of advanced directives: [place] Secretary of [place] ([email or web address])  Or copy and paste the following to your web browser: [email or web address]

## 2022-10-16 ENCOUNTER — Encounter: Payer: Self-pay | Admitting: Internal Medicine

## 2022-10-17 NOTE — Telephone Encounter (Signed)
Jonathan Marsh was seen by general surgery recently and had a drainage procedure done.  He was also treated with antibiotics. He needs to reach out to general surgery ASAP regarding management (this is a surgical problem). Thanks

## 2023-02-21 ENCOUNTER — Encounter: Payer: Self-pay | Admitting: Family Medicine

## 2023-02-21 NOTE — Telephone Encounter (Signed)
I called the patient as an appt is needed and he has never been seen by Dr Casimiro Needle.  I offered to call Dr Lamar Sprinkles office to ask if someone at their office could see him sooner than the visit in October and he stated they called him back and scheduled a visit on 7/29.  Appt was scheduled for 7/26 with Dr Casimiro Needle for transfer of care visit.

## 2023-03-09 ENCOUNTER — Ambulatory Visit: Payer: Medicare Other | Admitting: Family Medicine

## 2023-03-12 ENCOUNTER — Ambulatory Visit: Payer: Medicare Other | Admitting: Physician Assistant

## 2023-05-02 ENCOUNTER — Ambulatory Visit: Payer: Medicare Other | Admitting: Gastroenterology

## 2023-05-24 ENCOUNTER — Ambulatory Visit: Payer: Medicare Other | Admitting: Physician Assistant

## 2023-05-25 ENCOUNTER — Telehealth: Payer: Self-pay

## 2023-05-25 NOTE — Telephone Encounter (Signed)
Ok to send verbal orders

## 2023-05-25 NOTE — Telephone Encounter (Signed)
Chrissie Noa, RN, with [facility]. Would like to know if Dr. Casimiro Needle okay with their order plan for pt.   He states pt will be discharge for his picc line this Sunday and still seeing once a wk for pain medication.   Call back number [phone]  Please advise.

## 2023-05-28 ENCOUNTER — Telehealth: Payer: Self-pay

## 2023-05-28 NOTE — Transitions of Care (Post Inpatient/ED Visit) (Addendum)
05/28/2023  Name: Jonathan Marsh MRN: [id number] DOB: 11-23-1985  Today's TOC FU Call Status: Today's TOC FU Call Status:: Successful TOC FU Call Completed TOC FU Call Complete Date: 05/28/23 Patient's Name and Date of Birth confirmed.  Transition Care Management Follow-up Telephone Call Date of Discharge: 05/23/23 Discharge Facility: Other [facility]) Name of Other ([facility]) Discharge Facility: [facility] Type of Discharge: Inpatient Admission Primary Inpatient Discharge Diagnosis:: "hypoxemia requiring oxygen supplemental oxygen" How have you been since you were released from the hospital?: Better (pt voices he is doing okay-still in bed-just took some pain meds about an hr ago-vocies meds due to help some, appetitei remains decreased, chronic diarrhea for yrs per pt, completed IV abx therapy) Any questions or concerns?: No  Items Reviewed: Did you receive and understand the discharge instructions provided?: Yes Medications obtained,verified, and reconciled?: Partial Review Completed Reason for Partial Mediation Review: pt not feeling well during call-reviewed sx mgmt & new emds Any new allergies since your discharge?: No Dietary orders reviewed?: Yes Type of Diet Ordered:: regular as tolerated Do you have support at home?: No  Medications Reviewed Today: Medications Reviewed Today     Reviewed by Charlyn Minerva, RN (Registered Nurse) on 05/28/23 at 1116  Med List Status: <None>   Medication Order Taking? Sig Documenting Provider Last Dose Status Informant  acetaminophen (TYLENOL) 325 MG tablet [id number] Yes Take 975 mg by mouth 3 (three) times daily. Take 3 tablets (975 mg total) by mouth 3 (three) times daily for 10 days Obtain over the counter [provider] Taking Active Self  Alpha-Lipoic Acid 600 MG CAPS [id number]  Take 600 mg by mouth in the morning. [provider]  Active Self  ALPRAZolam Prudy Feeler) 0.5 MG tablet [id number]  Take 0.5 mg by  mouth in the morning and at bedtime. [provider]  Active Self  Cyanocobalamin (VITAMIN B-12 PO) [id number]  Place 1,500 mcg under the tongue in the morning. [provider]  Active Self  diltiazem 2 % GEL [id number]  Apply 1 Application topically 5 (five) times daily. Hilarie Fredrickson, MD  Active Self  fluocinolone (SYNALAR) 0.01 % external solution [id number]  Apply topically 2 (two) times daily. Wynn Banker, MD  Active Self  gabapentin (NEURONTIN) 300 MG capsule [id number]  Take 600 mg by mouth in the morning, at noon, and at bedtime. [provider]  Expired 11/30/22 2359 Self  ketoconazole (NIZORAL) 2 % shampoo [id number]  Apply 1 application. topically 2 (two) times a week. Wynn Banker, MD  Active Self  Melatonin 10 MG CAPS [id number]  Take 10 mg by mouth at bedtime as needed (sleep). [provider]  Active Self  methadone (DOLOPHINE) 10 MG tablet [id number] Yes Take 10 mg by mouth 4 (four) times daily. [provider] Taking Active Self           Med Note Modena Jansky May 28, 2023 11:13 AM) Take 3 tablets (30 mg total) by mouth 2 (two) times daily AND 4 tablets (40 mg total) at bedtime. Do all this for 20 days  metoprolol tartrate (LOPRESSOR) 25 MG tablet [id number]  Take 0.5 tablets (12.5 mg total) by mouth 2 (two) times daily as needed.  Patient taking differently: Take 12.5 mg by mouth 2 (two) times daily as needed (heart rate (tachycardia)).   Corky Crafts, MD  Active Self  metroNIDAZOLE (FLAGYL) 500 MG tablet [id number]  Take 1 tablet (500 mg total) by mouth 3 (three)  times daily.  Patient not taking: Reported on 10/10/2022   Hilarie Fredrickson, MD  Active Self           Med Note Farris Has Mar 27, 2022 12:17 PM) 10 day therapy course patient began on 03/27/22  metroNIDAZOLE (FLAGYL) 500 MG tablet [id number]  Take 1 tablet (500 mg total) by mouth 2 (two) times daily.  Patient not taking: Reported on  10/10/2022   Hilarie Fredrickson, MD  Active   mirtazapine (REMERON) 15 MG tablet [id number]  Take 15 mg by mouth at bedtime. [provider]  Active   naloxone [facility]) nasal spray 4 mg/0.1 mL [id number]  Place into the nose. [provider]  Active   oxycodone (ROXICODONE) 30 MG immediate release tablet [id number] Yes Take 30 mg by mouth every 3 (three) hours. [provider] Taking Active Self           Med Note Modena Jansky May 28, 2023 11:13 AM) Take 2-3 tablets (30-45 mg total) by mouth every 4 (four) hours as needed (abdominal pain from tumor) for up to 15 days  pantoprazole (PROTONIX) 40 MG tablet [id number] Yes Take 40 mg by mouth 2 (two) times daily. [provider] Taking Active Self  pazopanib (VOTRIENT) 200 MG tablet [id number]  Take by mouth. [provider]  Active   prochlorperazine (COMPAZINE) 10 MG tablet [id number]  Take 10 tablets by mouth as needed.  Patient not taking: Reported on 10/10/2022   [provider]  Active   sildenafil (VIAGRA) 100 MG tablet [id number]  Take 1 tablet (100 mg total) by mouth as needed for erectile dysfunction. Karie Georges, MD  Active Self  simethicone [facility]) 80 MG chewable tablet [id number] Yes Chew 160 mg by mouth every 6 (six) hours as needed for flatulence. [provider] Taking Active Self  tadalafil (CIALIS) 5 MG tablet [id number]  Take 5 mg by mouth daily. [provider]  Active   testosterone cypionate (DEPOTESTOSTERONE CYPIONATE) 200 MG/ML injection [id number]  Inject 0.5 mLs into the skin once a week. [provider]  Active             Home Care and Equipment/Supplies: Were Home Health Services Ordered?: Yes Name of Home Health Agency:: [facility] Has Agency set up a time to come to your home?: Yes First Home Health Visit Date: 05/23/23 (pt voices he has completed [facility] RN services as he is done with getting IV abxs) Any new equipment or medical  supplies ordered?: Yes Name of Medical supply agency?: [facility]-IV abx and PICC line supplies Were you able to get the equipment/medical supplies?: Yes Do you have any questions related to the use of the equipment/supplies?: No  Functional Questionnaire: Do you need assistance with bathing/showering or dressing?: No Do you need assistance with meal preparation?: No Do you need assistance with eating?: No Do you have difficulty maintaining continence: No Do you need assistance with getting out of bed/getting out of a chair/moving?: No Do you have difficulty managing or taking your medications?: No  Follow up appointments reviewed: PCP Follow-up appointment confirmed?: No (pt voices that he would like to use [facility] as PCP-has not had appt with provider to establish care-aware that he needs to call-states he has mx specialists appts at present that he keeps him busy and he has over an hr drive to PCP office) MD Provider Line Number:[phone] Given: No Specialist Hospital Follow-up appointment confirmed?: Yes  Date of Specialist follow-up appointment?: 05/29/23 Follow-Up Specialty Provider:: Coralyn Helling Do you need transportation to your follow-up appointment?: No (pt confirms he drives himself to appts) Do you understand care options if your condition(s) worsen?: Yes-patient verbalized understanding  SDOH Interventions Today    Flowsheet Row Most Recent Value  SDOH Interventions   Food Insecurity Interventions Intervention Not Indicated  Transportation Interventions Intervention Not Indicated      TOC Interventions Today    Flowsheet Row Most Recent Value  TOC Interventions   TOC Interventions Discussed/Reviewed TOC Interventions Discussed  [pt to call office to schedule appt to establish care]      Interventions Today    Flowsheet Row Most Recent Value  Chronic Disease   Chronic disease during today's visit Other  General Interventions   General  Interventions Discussed/Reviewed General Interventions Discussed, Doctor Visits, Referral to Nurse  [discussed TOC program-ongoing RN calls and pt declined-states he will call if he has any needs/concerns]  Doctor Visits Discussed/Reviewed Doctor Visits Discussed, PCP, Specialist  Education Interventions   Education Provided Provided Education  Provided Verbal Education On Nutrition, When to see the doctor, Medication  Nutrition Interventions   Nutrition Discussed/Reviewed Nutrition Discussed, Fluid intake  Pharmacy Interventions   Pharmacy Dicussed/Reviewed Pharmacy Topics Discussed, Medications and their functions  Safety Interventions   Safety Discussed/Reviewed Safety Discussed       Antionette Fairy, RN,BSN,CCM RN Care Manager Transitions of Care  [place]  Direct Phone: [phone] Toll Free: [phone] Fax: [phone]

## 2023-05-28 NOTE — Telephone Encounter (Signed)
Contacted William and update him of approval VO from Dr. Casimiro Needle. Verbalized understanding.

## 2023-08-10 ENCOUNTER — Telehealth: Payer: Self-pay

## 2023-08-10 NOTE — Transitions of Care (Post Inpatient/ED Visit) (Unsigned)
   08/10/2023  Name: Jaecion Albini MRN: [id number] DOB: October 25, 1985  Today's TOC FU Call Status: Today's TOC FU Call Status:: Unsuccessful Call (1st Attempt) Unsuccessful Call (1st Attempt) Date: 08/10/23  Attempted to reach the patient regarding the most recent Inpatient/ED visit.  Follow Up Plan: Additional outreach attempts will be made to reach the patient to complete the Transitions of Care (Post Inpatient/ED visit) call.   Abby Kashina Mecum, CMA  [facility] AWV Team Direct Dial: [phone]

## 2023-08-13 NOTE — Transitions of Care (Post Inpatient/ED Visit) (Unsigned)
   08/13/2023  Name: Jonathan Marsh MRN: [id number] DOB: 21-Oct-1985  Today's TOC FU Call Status: Today's TOC FU Call Status:: Unsuccessful Call (2nd Attempt) Unsuccessful Call (1st Attempt) Date: 08/10/23 Unsuccessful Call (2nd Attempt) Date: 08/13/23  Attempted to reach the patient regarding the most recent Inpatient/ED visit.  Follow Up Plan: Additional outreach attempts will be made to reach the patient to complete the Transitions of Care (Post Inpatient/ED visit) call.   Abby Emie Sommerfeld, CMA  [facility] AW[facility] Team Direct Dial: [phone]

## 2023-10-09 ENCOUNTER — Telehealth: Payer: Self-pay

## 2023-10-09 NOTE — Telephone Encounter (Signed)
 Spoke with the patient and informed him of the message below.  Patient stated the orders should come from his specialist at [facility].  I scheduled a TOC visit with Dr Casimiro Needle on 3/14 and spoke with [place] and advised she contact [facility] for orders.

## 2023-10-09 NOTE — Telephone Encounter (Signed)
 Copied from CRM [id number]. Topic: Clinical - Home Health Verbal Orders >> Oct 09, 2023  8:51 AM Deaijah H wrote: Caller/Agency: [facility]  Callback Number: [phone] Service Requested: Skilled Nursing Frequency: 1w9 Any new concerns about the patient? No

## 2023-10-09 NOTE — Telephone Encounter (Signed)
 Please advise-- I have never seen this patient in the office-- looks like he used to be Dr. Dorita Fray patient and he never came in for a transitional care visit-- I am not sure if I am able to place skilled nursing orders on a patient I have never seen

## 2023-10-26 ENCOUNTER — Ambulatory Visit: Payer: Medicare Other | Admitting: Family Medicine

## 2023-12-05 ENCOUNTER — Encounter: Payer: Self-pay | Admitting: Family Medicine

## 2023-12-07 ENCOUNTER — Ambulatory Visit: Admitting: Adult Health

## 2023-12-07 ENCOUNTER — Encounter: Payer: Self-pay | Admitting: Adult Health

## 2023-12-07 VITALS — BP 100/70 | HR 53 | Temp 98.2°F | Ht 78.0 in | Wt 222.0 lb

## 2023-12-07 DIAGNOSIS — S39012A Strain of muscle, fascia and tendon of lower back, initial encounter: Secondary | ICD-10-CM

## 2023-12-07 MED ORDER — METHYLPREDNISOLONE 4 MG PO TBPK: ORAL_TABLET | ORAL | 0 refills | Status: DC

## 2023-12-07 MED ORDER — CYCLOBENZAPRINE HCL 10 MG PO TABS: 10.0000 mg | ORAL_TABLET | Freq: Three times a day (TID) | ORAL | 0 refills | Status: DC

## 2023-12-07 NOTE — Progress Notes (Signed)
 Subjective:    Patient ID: Jonathan Marsh, male    DOB: 07/22/1986, 38 y.o.   MRN: [id number]  HPI  38 year old male who  has a past medical history of Ampullary adenoma, Complication of anesthesia, Desmoid tumor of abdomen, Gardner syndrome, Gastric polyp, GERD (gastroesophageal reflux disease), Headache, Multiple gastric polyps, and Polyp of duodenum.  He presents to the office today for an acute issue.  He has a health history significant for desmoid tumor removal of the abdomen and reports being bedridden September until March.  Reports that he was at home and started having a coughing spell about 2 weeks ago and fell up pop in his lower back when he sat up quickly.  Pain continued throughout the week, he was taking meloxicam at home and this helped originally but after a few days the meloxicam was no longer taking care of his pain.  5 days ago he went to urgent care and had an x-ray of his lower lumbar spine which was negative.  He was given 5 days in his own 30 mg daily, 5 days of Toradol p.o. was given a Toradol injection.  He reports that this has helped and he is about 75% better but continues to have pain in his lower right back especially with lateral movements, bending, twisting, and using stairs.  Review of Systems See HPI   Past Medical History:  Diagnosis Date   Ampullary adenoma    Complication of anesthesia    aspirated under anesthesia   Desmoid tumor of abdomen    Gardner syndrome    Gastric polyp    GERD (gastroesophageal reflux disease)    Headache    mild - stress related   Multiple gastric polyps    Polyp of duodenum     Social History   Socioeconomic History   Marital status: Married    Spouse name: Not on file   Number of children: Not on file   Years of education: Not on file   Highest education level: Not on file  Occupational History   Not on file  Tobacco Use   Smoking status: Former    Current packs/day: 0.00    Types: Cigarettes    Quit date:  01/01/2008    Years since quitting: 15.9    Passive exposure: Never   Smokeless tobacco: Never  Vaping Use   Vaping status: Never Used  Substance and Sexual Activity   Alcohol use: No    Alcohol/week: 0.0 standard drinks of alcohol   Drug use: No   Sexual activity: Not on file  Other Topics Concern   Not on file  Social History Narrative   Work or School: 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 Last day of work was 11/16/16.       Home Situation: lives with wife      Spiritual Beliefs: none      Lifestyle: no regular exercise; diet is bad      Social Drivers of Corporate investment banker Strain: High Risk (08/13/2023)   Received from [facility]   Overall Financial Resource Strain (CARDIA)    Difficulty of Paying Living Expenses: Very hard  Food Insecurity: No Food Insecurity (08/13/2023)   Received from [facility]   Hunger Vital Sign    Worried About Running Out of Food in the Last Year: Never true    Ran Out of Food in the Last Year: Never  true  Transportation Needs: No Transportation Needs (08/21/2023)   Received from [facility]   PRAPARE - Transportation    Lack of Transportation (Non-Medical): No    In the past 12 months, has lack of transportation kept you from medical appointments or from getting medications?: No  Physical Activity: Inactive (10/29/2020)   Received from [facility], [facility]   Exercise Vital Sign    Days of Exercise per Week: 0 days    Minutes of Exercise per Session: 0 min  Stress: Stress Concern Present (10/29/2020)   Received from [facility], [facility]   [employer] of Occupational Health - Occupational Stress Questionnaire    Feeling of Stress : Very much  Social Connections: Unknown (12/12/2021)   Received from [facility], [facility]   Social Network    Social Network: Not  on file  Intimate Partner Violence: Unknown (11/14/2021)   Received from [facility]   HITS    Physically Hurt: Not on file    Insult or Talk Down To: Not on file    Threaten Physical Harm: Not on file    Scream or Curse: Not on file    Past Surgical History:  Procedure Laterality Date   APPENDECTOMY     COLECTOMY     age 77 due to gardners syndrome- has j pouch reconstruction   COLONOSCOPY     ESOPHAGOGASTRODUODENOSCOPY N/A 03/30/2015   Procedure: ESOPHAGOGASTRODUODENOSCOPY (EGD);  Surgeon: Tobin Forts, MD;  Location: [facility];  Service: Endoscopy;  Laterality: N/A;  need side viewing scope   ESOPHAGOGASTRODUODENOSCOPY N/A 01/18/2016   Procedure: ESOPHAGOGASTRODUODENOSCOPY (EGD);  Surgeon: Tobin Forts, MD;  Location: [facility];  Service: Endoscopy;  Laterality: N/A;   use ercp scope    ESOPHAGOGASTRODUODENOSCOPY (EGD) WITH PROPOFOL  N/A 07/19/2018   Procedure: ESOPHAGOGASTRODUODENOSCOPY (EGD) WITH PROPOFOL ;  Surgeon: Tobin Forts, MD;  Location: [facility];  Service: Endoscopy;  Laterality: N/A;  ERCP SCOPE   ESOPHAGOGASTRODUODENOSCOPY (EGD) WITH PROPOFOL  N/A 02/02/2022   Procedure: ESOPHAGOGASTRODUODENOSCOPY (EGD) WITH PROPOFOL ;  Surgeon: Tobin Forts, MD;  Location: [facility];  Service: Gastroenterology;  Laterality: N/A;  using ERCP scope   SVT ABLATION N/A 03/30/2022   Procedure: SVT ABLATION;  Surgeon: Lei Pump, MD;  Location: [facility] INVASIVE CV LAB;  Service: Cardiovascular;  Laterality: N/A;   UPPER GASTROINTESTINAL ENDOSCOPY      Family History  Problem Relation Age of Onset   Cancer Mother        GI cancer   Drug abuse Mother    Colon polyps Mother    Other Mother        Gardners syndrome   Familial polyposis Mother    Lung cancer Maternal Grandmother    Colon cancer Maternal Grandfather    Colon polyps Maternal Grandfather    Other Maternal Grandfather        Gardners syndrome   Brain cancer Paternal Grandmother    Liver cancer  Paternal Grandmother    Sleep apnea Neg Hx     Allergies  Allergen Reactions   Zofran  [Ondansetron ] Anaphylaxis and Rash     Headache,Migraine    Current Outpatient Medications on File Prior to Visit  Medication Sig Dispense Refill   acetaminophen  (TYLENOL ) 325 MG tablet Take 975 mg by mouth 3 (three) times daily. Take 3 tablets (975 mg total) by mouth 3 (three) times daily for 10 days Obtain over the counter  Alpha-Lipoic Acid 600 MG CAPS Take 600 mg by mouth in the morning.     ALPRAZolam (XANAX) 0.5 MG tablet Take 0.5 mg by mouth in the morning and at bedtime.     Cyanocobalamin (VITAMIN B-12 PO) Place 1,500 mcg under the tongue in the morning.     diltiazem  2 % GEL Apply 1 Application topically 5 (five) times daily. 30 g 2   fluocinolone  (SYNALAR ) 0.01 % external solution Apply topically 2 (two) times daily. 60 mL 2   gabapentin (NEURONTIN) 300 MG capsule Take 600 mg by mouth in the morning, at noon, and at bedtime.     ketoconazole  (NIZORAL ) 2 % shampoo Apply 1 application. topically 2 (two) times a week. 120 mL 5   Melatonin 10 MG CAPS Take 10 mg by mouth at bedtime as needed (sleep).     methadone (DOLOPHINE) 10 MG tablet Take 10 mg by mouth 4 (four) times daily.     metoprolol  tartrate (LOPRESSOR ) 25 MG tablet Take 0.5 tablets (12.5 mg total) by mouth 2 (two) times daily as needed. (Patient taking differently: Take 12.5 mg by mouth 2 (two) times daily as needed (heart rate (tachycardia)).) 90 tablet 2   metroNIDAZOLE  (FLAGYL ) 500 MG tablet Take 1 tablet (500 mg total) by mouth 3 (three) times daily. (Patient not taking: Reported on 10/10/2022) 30 tablet 0   metroNIDAZOLE  (FLAGYL ) 500 MG tablet Take 1 tablet (500 mg total) by mouth 2 (two) times daily. (Patient not taking: Reported on 10/10/2022) 14 tablet 0   mirtazapine (REMERON) 15 MG tablet Take 15 mg by mouth at bedtime.     naloxone (NARCAN) nasal spray 4 mg/0.1 mL Place into the nose.     oxycodone  (ROXICODONE ) 30 MG  immediate release tablet Take 30 mg by mouth every 3 (three) hours.     pantoprazole (PROTONIX) 40 MG tablet Take 40 mg by mouth 2 (two) times daily.     pazopanib (VOTRIENT) 200 MG tablet Take by mouth.     prochlorperazine (COMPAZINE) 10 MG tablet Take 10 tablets by mouth as needed. (Patient not taking: Reported on 10/10/2022)     sildenafil  (VIAGRA ) 100 MG tablet Take 1 tablet (100 mg total) by mouth as needed for erectile dysfunction. 20 tablet 0   simethicone (MYLICON) 80 MG chewable tablet Chew 160 mg by mouth every 6 (six) hours as needed for flatulence.     tadalafil (CIALIS) 5 MG tablet Take 5 mg by mouth daily.     testosterone  cypionate (DEPOTESTOSTERONE CYPIONATE) 200 MG/ML injection Inject 0.5 mLs into the skin once a week.     No current facility-administered medications on file prior to visit.    BP 100/70   Pulse (!) 53   Temp 98.2 F (36.8 C) (Oral)   Ht 6\' 6"  (1.981 m)   Wt 222 lb (100.7 kg)   SpO2 99%   BMI 25.65 kg/m       Objective:   Physical Exam Vitals and nursing note reviewed.  Constitutional:      Appearance: Normal appearance.  Musculoskeletal:        General: Tenderness present. Normal range of motion.       Legs:  Skin:    General: Skin is warm and dry.     Capillary Refill: Capillary refill takes less than 2 seconds.  Neurological:     General: No focal deficit present.     Mental Status: He is alert and oriented to person, place, and time.  Psychiatric:  Mood and Affect: Mood normal.        Behavior: Behavior normal.        Thought Content: Thought content normal.        Judgment: Judgment normal.        Assessment & Plan:   1. Strain of lumbar region, initial encounter (Primary) - wills end in medrol dose pack and Flexeril  - Follow up early next week if not resolved  - cyclobenzaprine (FLEXERIL) 10 MG tablet; Take 1 tablet (10 mg total) by mouth 3 (three) times daily.  Dispense: 30 tablet; Refill: 0 - methylPREDNISolone  (MEDROL DOSEPAK) 4 MG TBPK tablet; Tablet as directed  Dispense: 21 tablet; Refill: 0   Alto Atta, NP

## 2023-12-14 ENCOUNTER — Encounter: Payer: Self-pay | Admitting: Adult Health

## 2023-12-14 DIAGNOSIS — S39012A Strain of muscle, fascia and tendon of lower back, initial encounter: Secondary | ICD-10-CM

## 2023-12-14 MED ORDER — CYCLOBENZAPRINE HCL 10 MG PO TABS: 10.0000 mg | ORAL_TABLET | Freq: Three times a day (TID) | ORAL | 0 refills | Status: DC

## 2023-12-14 NOTE — Telephone Encounter (Signed)
**Note De-identified  [place] Obfuscation** Please advise 

## 2023-12-14 NOTE — Telephone Encounter (Signed)
 I sent in a refill of the muscle relaxer. Please remind the patient that he has not yet established his care with me in the office (I have never seen him since I took over for Dr. Koberlein 2 years ago) and to please have him schedule a visit so we can continue to care for him as his primary care provider.

## 2023-12-14 NOTE — Telephone Encounter (Signed)
 Called pt and advised of message below. Pt stated he will call back to schedule in 2 weeks when he is in town.

## 2023-12-29 ENCOUNTER — Encounter: Payer: Self-pay | Admitting: Adult Health

## 2023-12-29 DIAGNOSIS — S39012A Strain of muscle, fascia and tendon of lower back, initial encounter: Secondary | ICD-10-CM

## 2024-01-01 NOTE — Telephone Encounter (Signed)
**Note De-identified  [place] Obfuscation** Please advise 

## 2024-01-01 NOTE — Telephone Encounter (Signed)
 Ok to refill the cyclobenzaprine  and the meloxicam, however if he is still having a lot of pain I would worry about giving him more steroids since he has had 2 courses already-- he might need additional imaging studies or a referral since he is not improving with oral medication

## 2024-01-02 ENCOUNTER — Other Ambulatory Visit: Payer: Self-pay

## 2024-01-02 ENCOUNTER — Ambulatory Visit: Admitting: Adult Health

## 2024-01-02 DIAGNOSIS — S39012A Strain of muscle, fascia and tendon of lower back, initial encounter: Secondary | ICD-10-CM

## 2024-01-02 MED ORDER — CYCLOBENZAPRINE HCL 10 MG PO TABS: 10.0000 mg | ORAL_TABLET | Freq: Three times a day (TID) | ORAL | 0 refills | Status: DC

## 2024-01-02 NOTE — Telephone Encounter (Signed)
 Pt scheduled with Randel Buss but advised from last note that pt needed to f/u with Dr.Michaels. Pt stated that Dr. Larene Pleasant did not have any appt. That was soon so he scheduled with [facility]. Pt was rescheduled with PCP and refills were sent in, pt advised to keep appt. And verbalized understanding.

## 2024-01-03 NOTE — Telephone Encounter (Signed)
 Thanks-- just FYI this patient has never actually seen me in the office-- he was a previous Koberlein patient who was assigned to me but he never actually came in to establish his care with me. Ok to close task

## 2024-01-11 ENCOUNTER — Telehealth: Payer: Self-pay | Admitting: Family Medicine

## 2024-01-11 ENCOUNTER — Ambulatory Visit: Admitting: Family Medicine

## 2024-01-16 ENCOUNTER — Ambulatory Visit: Payer: Self-pay

## 2024-01-16 NOTE — Telephone Encounter (Signed)
 Ok to close

## 2024-01-16 NOTE — Telephone Encounter (Signed)
 FYI Only or Action Required?: FYI only for provider  Patient was last seen in primary care on 12/07/23. Called Nurse Triage reporting Hip Pain. Symptoms began about a month ago. Interventions attempted: OTC medications: Aleve Back and Muscle and Ice/heat application. Symptoms are: right hip pain unchanged.  Triage Disposition: See PCP When Office is Open (Within 3 Days)  Patient/caregiver understands and will follow disposition?: Yes                   Copied from CRM [id number]. Topic: Clinical - Red Word Triage >> Jan 16, 2024 10:03 AM Luane Rumps D wrote: Red Word that prompted transfer to Nurse Triage: Back/hip pain 7/10, can barely walk - unable to go up or down stairs at all. Reason for Disposition  [1] MODERATE pain (e.g., interferes with normal activities, limping) AND [2] present > 3 days  Answer Assessment - Initial Assessment Questions 1. LOCATION and RADIATION: "Where is the pain located?"      Right.  2. QUALITY: "What does the pain feel like?"  (e.g., sharp, dull, aching, burning)     Aching, sharp.  3. SEVERITY: "How bad is the pain?" "What does it keep you from doing?"   (Scale 1-10; or mild, moderate, severe)   -  MILD (1-3): doesn't interfere with normal activities    -  MODERATE (4-7): interferes with normal activities (e.g., work or school) or awakens from sleep, limping    -  SEVERE (8-10): excruciating pain, unable to do any normal activities, unable to walk     6-7/10.  4. ONSET: "When did the pain start?" "Does it come and go, or is it there all the time?"     About a month. It eases up 30 minutes after taking Aleve and lasts half they day but then comes back. He states the pain has stayed the same over the past month.  5. WORK OR EXERCISE: "Has there been any recent work or exercise that involved this part of the body?"      No.  6. CAUSE: "What do you think is causing the hip pain?"      Patient states he was bedridden for several months due to  having a tumor removed, September 2024 thru January 2025 he was hospitalized. He states over the past month he had an episode in the middle of the night with coughing and when he sat up he heard a pop and the pain followed.  7. AGGRAVATING FACTORS: "What makes the hip pain worse?" (e.g., walking, climbing stairs, running)     Climbing up or down stairs, bending over.  8. OTHER SYMPTOMS: "Do you have any other symptoms?" (e.g., back pain, pain shooting down leg,  fever, rash)     No.  Denies back pain, pain shooting down right leg, fever, rash, numbness and tingling in right leg. Patient states at urgent care he was given a toradol shot and at PCP office [facility] placed him on a steroid which seemed to help.  Protocols used: Hip Pain-A-AH

## 2024-01-17 ENCOUNTER — Ambulatory Visit (INDEPENDENT_AMBULATORY_CARE_PROVIDER_SITE_OTHER): Admitting: Family Medicine

## 2024-01-17 ENCOUNTER — Ambulatory Visit

## 2024-01-17 ENCOUNTER — Encounter: Payer: Self-pay | Admitting: Family Medicine

## 2024-01-17 VITALS — BP 92/60 | HR 95 | Temp 98.0°F | Ht 78.0 in | Wt 218.1 lb

## 2024-01-17 DIAGNOSIS — L219 Seborrheic dermatitis, unspecified: Secondary | ICD-10-CM

## 2024-01-17 DIAGNOSIS — L658 Other specified nonscarring hair loss: Secondary | ICD-10-CM | POA: Diagnosis not present

## 2024-01-17 DIAGNOSIS — M25551 Pain in right hip: Secondary | ICD-10-CM

## 2024-01-17 DIAGNOSIS — T50905A Adverse effect of unspecified drugs, medicaments and biological substances, initial encounter: Secondary | ICD-10-CM | POA: Diagnosis not present

## 2024-01-17 MED ORDER — KETOCONAZOLE 2 % EX SHAM: 1.0000 | MEDICATED_SHAMPOO | CUTANEOUS | 5 refills | Status: AC

## 2024-01-17 MED ORDER — MINOXIDIL FOR MEN 5 % EX SOLN: 1.0000 | Freq: Two times a day (BID) | CUTANEOUS | 5 refills | Status: AC

## 2024-01-17 MED ORDER — PREDNISONE 20 MG PO TABS: ORAL_TABLET | ORAL | 0 refills | Status: AC

## 2024-01-17 MED ORDER — DUTASTERIDE 0.5 MG PO CAPS: 0.5000 mg | ORAL_CAPSULE | Freq: Every day | ORAL | 1 refills | Status: DC

## 2024-01-17 NOTE — Patient Instructions (Addendum)
 Vitamin D3 2000 unit capsules daily  Add a multivitamin daily to replenish  Nizoral  shampoo

## 2024-01-17 NOTE — Progress Notes (Signed)
 Established Patient Office Visit  Subjective   Patient ID: Jonathan Marsh, male    DOB: Nov 22, 1985  Age: 38 y.o. MRN: [id number]  Chief Complaint  Patient presents with   Hip Pain    Patient complains of right hip pain x1 month, no known injury, patient was bedridden from September to January, recalls waking up coughing and heard a pop in the hip, seen by Randel Buss and has tried Aleve with no relief    Pt reports that he was seen previously for the hip pain. He had been in the hospital for about 4 months from sept to January due to a prolonged illness and when he was released maybe he thought he was doing to much at home. States that he had gotten out of bed and he felt a pop and there was extreme pain after the event. He was using aleve and ibuprofen at home which seemed to be helping initially however it became ineffective. He was seen at [facility] and then here with [facility], was given steroids each time and he did have improvement in the pain with the steroid treatments, but after her stopped the steroids the pain would return. He reports significantly reduced range of motion and increased pain with bearing weight. States that he can barely walk without assistance.   Patient has a history of desmoid tumors and I spent 20 minutes reviewing his discharge summary and labs from the last hospital stay. Also reviewed labs from 01/02/24.  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   Pt is also reporting burning flaky, sometimes reddish patches of skin on his face, eyebrows and scalp. States that he has a lot of dandruff shedding as well.    Current Outpatient Medications  Medication Instructions   acetaminophen  (TYLENOL ) 975 mg, 3 times daily   Alpha-Lipoic Acid 600 mg, Every morning   ALPRAZolam (XANAX)  0.5 mg, 2 times daily   Cyanocobalamin (VITAMIN B-12 PO) 1,500 mcg, Every morning   Diphenoxylate-Atropine (LOMOTIL PO) 4 times daily   dutasteride (AVODART) 0.5 mg, Oral, Daily   fluocinolone  (SYNALAR ) 0.01 % external solution Topical, 2 times daily   gabapentin (NEURONTIN) 600 mg, Oral, 3 times daily   imatinib (GLEEVEC) 400 mg, Daily   ketoconazole  (NIZORAL ) 2 % shampoo 1 Application, Topical, 2 times weekly, Start with daily application for the first 7-14 days, leave on for 5 minutes, then can reduce to twice a week   loperamide (IMODIUM A-D) 2 MG tablet 4 times daily PRN   Melatonin 10 mg, At bedtime PRN   methadone (DOLOPHINE) 10 mg, 4 times daily   MINOXIDIL, TOPICAL, (MINOXIDIL FOR MEN) 5 % SOLN 1 Application, Apply externally, 2 times daily   naloxone (NARCAN) nasal spray 4 mg/0.1 mL Place into the nose.   oxycodone  (ROXICODONE ) 30 mg, Every  3 hours   predniSONE (DELTASONE) 20 MG tablet Take 3 tablets (60 mg total) by mouth daily with breakfast for 4 days, THEN 2 tablets (40 mg total) daily with breakfast for 4 days, THEN 1 tablet (20 mg total) daily with breakfast for 4 days, THEN 0.5 tablets (10 mg total) daily with breakfast for 4 days.   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 Negative for chills and fever.  Gastrointestinal:  Positive for diarrhea, nausea and vomiting.  Musculoskeletal:  Positive for joint pain.  All other systems reviewed  and are negative.     Objective:     BP 92/60   Pulse 95   Temp 98 F (36.7 C) (Oral)   Ht 6\' 6"  (1.981 m)   Wt 218 lb 1.6 oz (98.9 kg)   SpO2 96%   BMI 25.20 kg/m    Physical Exam Vitals reviewed.  Constitutional:      Appearance: Normal appearance. He is normal weight. He is ill-appearing (chronically).  Cardiovascular:     Rate and Rhythm: Normal rate and regular rhythm.     Heart sounds: Normal heart sounds. No murmur heard. Pulmonary:     Effort: Pulmonary effort is normal.     Breath sounds: Normal breath sounds. No wheezing.  Abdominal:     General: Abdomen is flat. Bowel sounds are normal.  Skin:    Comments: Multiple patches of flaky skin on the scalp with some scattered slightly erythematous areas. Significant dandruff, there are white tiny papules on the nasolabial folds as well.  Neurological:     General: No focal deficit present.     Mental Status: He is alert and oriented to person, place, and time. Mental status is at baseline.  Psychiatric:        Mood and Affect: Mood normal.        Behavior: Behavior normal.      No results found for any visits on 01/17/24.    The ASCVD Risk score (Arnett DK, et al., 2019) failed to calculate for the following reasons:   The 2019 ASCVD risk score is only valid for ages 45 to 64    Assessment & Plan:  Acute right hip pain Persistent pain despite treatment with steroids, muscle relaxers, toradol, in addition to his regularly prescribed opioid medications. Will check hip films and send to orthopedics for further recommendations. Will also rx a longer course of steroids -- pt does not report any infections currently. Will see him back in 6 months for his regular follow up visit.  -     Ambulatory referral to Orthopedic Surgery -     DG HIP UNILAT W OR W/O PELVIS 2-3 VIEWS RIGHT; Future -     predniSONE; Take 3 tablets (60 mg total) by mouth daily with breakfast for 4 days, THEN 2 tablets (40 mg total) daily with  breakfast for 4 days, THEN 1 tablet (20 mg total) daily with breakfast for 4 days, THEN 0.5 tablets (10 mg total) daily with breakfast for 4 days.  Dispense: 26 tablet; Refill: 0  Seborrheic dermatitis of scalp Will treat with ketoconazole  shampoo which he can also apply to his face.   -     Ketoconazole ; Apply 1 Application topically 2 (two) times a week. Start with daily application for the first 7-14 days, leave on for 5 minutes, then can reduce to twice a week  Dispense: 120 mL; Refill:  5  Drug-related hair loss Likely due to severe prolonged illness/ desmoid tumors -- pt was in hospital for 4 months at [facility] due to complications from the de-bulking surgery. 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  -     Minoxidil for Men; Apply 1 Application topically in the morning and at bedtime.  Dispense: 120 mL; Refill: 5 -     Dutasteride; Take 1 capsule (0.5 mg total) by mouth daily.  Dispense: 90 capsule; Refill: 1     No follow-ups on file.    Aida House, MD

## 2024-01-18 ENCOUNTER — Ambulatory Visit: Admitting: Physician Assistant

## 2024-01-18 ENCOUNTER — Other Ambulatory Visit (INDEPENDENT_AMBULATORY_CARE_PROVIDER_SITE_OTHER): Payer: Self-pay

## 2024-01-18 DIAGNOSIS — G5701 Lesion of sciatic nerve, right lower limb: Secondary | ICD-10-CM

## 2024-01-18 NOTE — Progress Notes (Signed)
 Office Visit Note   Patient: Jonathan Marsh           Date of Birth: [birth year]           MRN: [id number] Visit Date: 01/18/2024              Requested by: Aida House, MD [address] [place],  [place] [address] PCP: Aida House, MD   Assessment & Plan: Visit Diagnoses:  1. Piriformis syndrome of right side     Plan: Impression is right sided piriformis syndrome with possible referred pain from the lumbar spine.  At this point, I have instructed him to start the steroid pack.  We have discussed starting a muscle relaxer but he tells me this did not help in the past so we will hold off for now.  I would like for him to go to outpatient physical therapy and have sent in a referral for this.  He will follow-up with us  as needed.  Call with concerns or questions.  Follow-Up Instructions: Return if symptoms worsen or fail to improve.   Orders:  Orders Placed This Encounter  Procedures   XR Lumbar Spine 2-3 Views   Ambulatory referral to Physical Therapy   No orders of the defined types were placed in this encounter.     Procedures: No procedures performed   Clinical Data: No additional findings.   Subjective: Chief Complaint  Patient presents with   Right Hip - Pain    HPI patient is a pleasant 38 year old gentleman who comes in today with right buttock pain.  Symptoms began about 2 months ago.  He denies any injury but does tell me he was hospitalized and bedridden for about 5 months up until the end of December of this past year.  The pain he has is primarily to the right side of piriformis.  He denies any anterior thigh, groin, back pain or any radiation down the legs.  He denies any paresthesias.  Pain appears to be worse on the right when he is lifting his left leg as well as when he is going from a seated to standing position or stair climbing.  He initially saw Dr. Anson Kinnier for this where he was prescribed steroids and muscle relaxers.  He did get some  relief from the steroid.  He was recently seen at an urgent care setting where 3-week steroid prescription was sent.  He has not started this yet.  He has not been to physical therapy.  He denies any bowel or bladder change or saddle paresthesias.  Review of Systems as detailed in HPI.  All others reviewed and are negative.   Objective: Vital Signs: There were no vitals taken for this visit.  Physical Exam well-developed well-nourished gentleman in no acute distress.  Alert and oriented x 3.  Ortho Exam lumbar spine exam: Unremarkable.  He does have moderate tenderness over the piriformis muscle.  No pain with logroll or FADIR testing.  Negative straight leg raise on the right.  He does have pain to the right buttock when I extend his left leg.  No focal weakness.  He is neurovascularly intact distally.  Specialty Comments:  No specialty comments available.  Imaging: X-rays of the right hip reviewed by me show no acute or structural abnormalities.   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   Stress headaches 08/18/2014   GAD (generalized anxiety disorder) 08/18/2014   Past Medical History:  Diagnosis Date   Ampullary adenoma    Complication of anesthesia    aspirated under anesthesia   Desmoid tumor of abdomen    Gardner syndrome    Gastric polyp    GERD (gastroesophageal reflux disease)    Headache    mild - stress related   Multiple gastric polyps    Polyp of duodenum     Family History  Problem Relation Age of Onset   Cancer Mother        GI cancer   Drug abuse Mother    Colon polyps Mother    Other Mother        Gardners syndrome   Familial polyposis Mother    Lung cancer Maternal Grandmother    Colon cancer Maternal Grandfather    Colon polyps Maternal Grandfather    Other Maternal Grandfather         Gardners syndrome   Brain cancer Paternal Grandmother    Liver cancer Paternal Grandmother    Sleep apnea Neg Hx     Past Surgical History:  Procedure Laterality Date   APPENDECTOMY     COLECTOMY     age 36 due to gardners syndrome- has j pouch reconstruction   COLONOSCOPY     ESOPHAGOGASTRODUODENOSCOPY N/A 03/30/2015   Procedure: ESOPHAGOGASTRODUODENOSCOPY (EGD);  Surgeon: Tobin Forts, MD;  Location: [facility];  Service: Endoscopy;  Laterality: N/A;  need side viewing scope   ESOPHAGOGASTRODUODENOSCOPY N/A 01/18/2016   Procedure: ESOPHAGOGASTRODUODENOSCOPY (EGD);  Surgeon: Tobin Forts, MD;  Location: [facility];  Service: Endoscopy;  Laterality: N/A;   use ercp scope    ESOPHAGOGASTRODUODENOSCOPY (EGD) WITH PROPOFOL  N/A 07/19/2018   Procedure: ESOPHAGOGASTRODUODENOSCOPY (EGD) WITH PROPOFOL ;  Surgeon: Tobin Forts, MD;  Location: [facility];  Service: Endoscopy;  Laterality: N/A;  ERCP SCOPE   ESOPHAGOGASTRODUODENOSCOPY (EGD) WITH PROPOFOL  N/A 02/02/2022   Procedure: ESOPHAGOGASTRODUODENOSCOPY (EGD) WITH PROPOFOL ;  Surgeon: Tobin Forts, MD;  Location: [facility];  Service: Gastroenterology;  Laterality: N/A;  using ERCP scope   SVT ABLATION N/A 03/30/2022   Procedure: SVT ABLATION;  Surgeon: Lei Pump, MD;  Location: [facility];  Service: Cardiovascular;  Laterality: N/A;   UPPER GASTROINTESTINAL ENDOSCOPY     Social History   Occupational History   Not on file  Tobacco Use   Smoking status: Former    Current packs/day: 0.00    Types: Cigarettes    Quit date: 01/01/2008    Years since quitting: 16.0    Passive exposure: Never   Smokeless tobacco: Never  Vaping Use   Vaping status: Never Used  Substance and Sexual Activity   Alcohol use: No    Alcohol/week: 0.0 standard drinks of alcohol   Drug use: No   Sexual activity: Not on file

## 2024-01-23 ENCOUNTER — Ambulatory Visit: Payer: Self-pay | Admitting: Family Medicine

## 2024-03-11 ENCOUNTER — Ambulatory Visit (INDEPENDENT_AMBULATORY_CARE_PROVIDER_SITE_OTHER): Admitting: Physician Assistant

## 2024-03-11 DIAGNOSIS — M545 Low back pain, unspecified: Secondary | ICD-10-CM | POA: Diagnosis not present

## 2024-03-11 DIAGNOSIS — G8929 Other chronic pain: Secondary | ICD-10-CM

## 2024-03-11 MED ORDER — PREDNISONE 10 MG (21) PO TBPK: ORAL_TABLET | ORAL | 0 refills | Status: AC

## 2024-03-11 NOTE — Progress Notes (Signed)
 Office Visit Note   Patient: Jonathan Marsh           Date of Birth: Jan 09, 1986           MRN: [id number] Visit Date: 03/11/2024              Requested by: Ozell Heron HERO, MD [address] [place],  [place] [address] PCP: Ozell Heron HERO, MD   Assessment & Plan: Visit Diagnoses:  1. Chronic right-sided low back pain, unspecified whether sciatica present     Plan: Impression is right-sided low back and SI joint pain and arthritis.  Patient has tried multiple prescription medications as well as approximately 8 weeks of physical therapy with only worsening pain.  I would like to go ahead and obtain an MRI of the lumbar spine to assess for structural abnormalities.  He will follow-up with Duwaine Pouch to discuss these results.  Follow-up with us  as needed.  Follow-Up Instructions: Return for f/u with megan to discuss mri lumbar spine.   Orders:  Orders Placed This Encounter  Procedures   MR Lumbar Spine w/o contrast   Meds ordered this encounter  Medications   predniSONE  (STERAPRED UNI-PAK 21 TAB) 10 MG (21) TBPK tablet    Sig: Take as directed    Dispense:  21 tablet    Refill:  0      Procedures: No procedures performed   Clinical Data: No additional findings.   Subjective: Chief Complaint  Patient presents with   Right Hip - Pain    HPI patient is a pleasant 38 year old gentleman who comes in today with continued right low back upper buttock and lateral hip pain.  He was seen by me for this about 2 months ago where he was referred to outpatient PT.  He notes worsening symptoms after approximately 8 weeks of PT.  The pain he has is constant.  Symptoms on the right are worse when he is lifting his left leg as well as with external rotation of the right hip.  He denies any weakness or paresthesias to the right lower extremity.  He has taken steroids in the past with good but temporary relief.  Aleve and muscle relaxers provide no relief.  He denies any bowel  or bladder change or saddle paresthesias.  Review of Systems as detailed in HPI.  All others reviewed and are negative.   Objective: Vital Signs: There were no vitals taken for this visit.  Physical Exam well-developed well-nourished gentleman in no acute distress.  Alert and oriented x 3.  Ortho Exam lumbar spine exam: No pain with lumbar extension or flexion.  He does have moderate tenderness to the right SI joint.  Mild tenderness into the piriformis.  Negative straight leg raise.  No focal weakness.  He is neurovascular intact distally.  Specialty Comments:  No specialty comments available.  Imaging: No new imaging.   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   Stress headaches 08/18/2014   GAD (generalized anxiety disorder) 08/18/2014   Past Medical History:  Diagnosis Date   Ampullary adenoma    Complication of anesthesia    aspirated under anesthesia   Desmoid tumor of abdomen    Gardner syndrome    Gastric polyp    GERD (gastroesophageal reflux disease)    Headache  mild - stress related   Multiple gastric polyps    Polyp of duodenum     Family History  Problem Relation Age of Onset   Cancer Mother        GI cancer   Drug abuse Mother    Colon polyps Mother    Other Mother        Gardners syndrome   Familial polyposis Mother    Lung cancer Maternal Grandmother    Colon cancer Maternal Grandfather    Colon polyps Maternal Grandfather    Other Maternal Grandfather        Gardners syndrome   Brain cancer Paternal Grandmother    Liver cancer Paternal Grandmother    Sleep apnea Neg Hx     Past Surgical History:  Procedure Laterality Date   APPENDECTOMY     COLECTOMY     age 45 due to gardners syndrome- has j pouch reconstruction   COLONOSCOPY     ESOPHAGOGASTRODUODENOSCOPY N/A 03/30/2015    Procedure: ESOPHAGOGASTRODUODENOSCOPY (EGD);  Surgeon: Norleen LOISE Kiang, MD;  Location: [facility];  Service: Endoscopy;  Laterality: N/A;  need side viewing scope   ESOPHAGOGASTRODUODENOSCOPY N/A 01/18/2016   Procedure: ESOPHAGOGASTRODUODENOSCOPY (EGD);  Surgeon: Norleen LOISE Kiang, MD;  Location: [facility];  Service: Endoscopy;  Laterality: N/A;   use ercp scope    ESOPHAGOGASTRODUODENOSCOPY (EGD) WITH PROPOFOL  N/A 07/19/2018   Procedure: ESOPHAGOGASTRODUODENOSCOPY (EGD) WITH PROPOFOL ;  Surgeon: Kiang Norleen LOISE, MD;  Location: [facility];  Service: Endoscopy;  Laterality: N/A;  ERCP SCOPE   ESOPHAGOGASTRODUODENOSCOPY (EGD) WITH PROPOFOL  N/A 02/02/2022   Procedure: ESOPHAGOGASTRODUODENOSCOPY (EGD) WITH PROPOFOL ;  Surgeon: Kiang Norleen LOISE, MD;  Location: [facility];  Service: Gastroenterology;  Laterality: N/A;  using ERCP scope   SVT ABLATION N/A 03/30/2022   Procedure: SVT ABLATION;  Surgeon: Inocencio Soyla Lunger, MD;  Location: [facility];  Service: Cardiovascular;  Laterality: N/A;   UPPER GASTROINTESTINAL ENDOSCOPY     Social History   Occupational History   Not on file  Tobacco Use   Smoking status: Former    Current packs/day: 0.00    Types: Cigarettes    Quit date: 01/01/2008    Years since quitting: 16.2    Passive exposure: Never   Smokeless tobacco: Never  Vaping Use   Vaping status: Never Used  Substance and Sexual Activity   Alcohol use: No    Alcohol/week: 0.0 standard drinks of alcohol   Drug use: No   Sexual activity: Not on file

## 2024-03-14 ENCOUNTER — Telehealth: Payer: Self-pay | Admitting: Physician Assistant

## 2024-03-14 ENCOUNTER — Other Ambulatory Visit: Payer: Self-pay

## 2024-03-14 DIAGNOSIS — G5701 Lesion of sciatic nerve, right lower limb: Secondary | ICD-10-CM

## 2024-03-14 DIAGNOSIS — M545 Low back pain, unspecified: Secondary | ICD-10-CM

## 2024-03-14 NOTE — Telephone Encounter (Signed)
 Order for pelvis has been entered as well.

## 2024-03-14 NOTE — Telephone Encounter (Signed)
 Patient called. Says the MRI should be for lumbar ans SI joint. Imaging has the referral different. Needs a new referral sent.

## 2024-03-14 NOTE — Telephone Encounter (Signed)
 Please put referral in to include SI joint.  If they cannot do this, include a pelvis

## 2024-03-25 ENCOUNTER — Telehealth: Payer: Self-pay

## 2024-03-25 NOTE — Transitions of Care (Post Inpatient/ED Visit) (Signed)
   03/25/2024  Name: Jonathan Marsh MRN: [id number] DOB: 10/09/1985  Today's TOC FU Call Status: Today's TOC FU Call Status:: Successful TOC FU Call Completed TOC FU Call Complete Date: 03/25/24 Patient's Name and Date of Birth confirmed.  Transition Care Management Follow-up Telephone Call Date of Discharge: 03/24/24 Discharge Facility: Other ([facility]) Name of Other ([facility]) Discharge Facility: [facility] Type of Discharge: Inpatient Admission Primary Inpatient Discharge Diagnosis:: Hypotension due to hypovolemia How have you been since you were released from the hospital?: Better Any questions or concerns?: No  Items Reviewed: Did you receive and understand the discharge instructions provided?: Yes Medications obtained,verified, and reconciled?: Yes (Medications Reviewed) Any new allergies since your discharge?: No  Home Care and Equipment/Supplies: Were Home Health Services Ordered?: Yes Name of Home Health Agency:: [facility] Has Agency set up a time to come to your home?: Yes First Home Health Visit Date: 03/25/24 Any new equipment or medical supplies ordered?: No   Placed call to patient and reviewed reason for call. Patient reports that he does not need any review of discharge instruction. Reports that he understands plan of care. Patient reports that he is feeling better. Home health nurse to see patient in 2 hours.  Patient denies any concerns. Confirmed PCP and suggested patient call and make a follow up appointment.  Note: declined TOC call and assessments.  Alan Ee, RN, BSN, CEN [employer]- Transition of Care Team.  [facility] [phone]

## 2024-04-01 ENCOUNTER — Telehealth: Payer: Self-pay | Admitting: Physician Assistant

## 2024-04-01 NOTE — Telephone Encounter (Signed)
 Patient called and ask if you could prescribe him some steroids for his hip. CB#[id number]

## 2024-04-02 MED ORDER — PREDNISONE 10 MG PO TABS: 10.0000 mg | ORAL_TABLET | Freq: Every day | ORAL | 0 refills | Status: AC

## 2024-04-02 NOTE — Addendum Note (Signed)
 Addended by: Firman Petrow R on: 04/02/2024 01:51 PM   Modules accepted: Orders

## 2024-04-13 ENCOUNTER — Ambulatory Visit
Admission: RE | Admit: 2024-04-13 | Discharge: 2024-04-13 | Disposition: A | Discharge status: Home / Self Care | Source: Ambulatory Visit | Attending: Physician Assistant | Admitting: Physician Assistant

## 2024-04-13 DIAGNOSIS — M545 Low back pain, unspecified: Secondary | ICD-10-CM

## 2024-04-13 DIAGNOSIS — G5701 Lesion of sciatic nerve, right lower limb: Secondary | ICD-10-CM

## 2024-04-15 ENCOUNTER — Telehealth: Payer: Self-pay | Admitting: Radiology

## 2024-04-15 NOTE — Telephone Encounter (Signed)
 [facility] called triage stat report abnormal MRI pelvis

## 2024-04-16 ENCOUNTER — Other Ambulatory Visit: Payer: Self-pay

## 2024-04-16 ENCOUNTER — Encounter: Payer: Self-pay | Admitting: Orthopaedic Surgery

## 2024-04-16 ENCOUNTER — Telehealth: Payer: Self-pay | Admitting: Physician Assistant

## 2024-04-16 DIAGNOSIS — G8929 Other chronic pain: Secondary | ICD-10-CM

## 2024-04-16 NOTE — Telephone Encounter (Signed)
 Patient called. Says the prednisone  did not do anything for him. Would like something else. Also the Dr. Is Dr. Ozell Key that he seen at [facility]

## 2024-04-16 NOTE — Telephone Encounter (Signed)
 Discussed lumbar spine and pelvis MRIs in detail with patient this morning.  He has a significant medical history consisting of desmoid tumors and is being followed by multiple providers within the [facility] network.  He was hospitalized with MSSA bacteremia (from tpn line) twice over the past month and was being followed by [facility].   He completed his course of IV abx last week and is not on any oral abx at this time.  Currently denies fevers or chills.  Supposed to be undergoing 5 organ transplant in the near future.  He has been getting routine CT scan abdomen/pelvis monthly at [facility].   After going over the MRI results with the patient today, we have discussed placing an urgent referral to [facility] (Dr. Vallerie) for antibiotics for possible SI joint infection.  He is currently afebrile and does not appear to have symptoms of sepsis.  As far as the soft tissue mass, patient tells me he is aware of this and his primary doctors are following with routine CT abdomen/pelvis scans

## 2024-04-16 NOTE — Telephone Encounter (Signed)
 Referral made

## 2024-04-16 NOTE — Telephone Encounter (Signed)
 Urgent referral has been made to him this morning. :)

## 2024-04-17 ENCOUNTER — Other Ambulatory Visit: Payer: Self-pay | Admitting: Physician Assistant

## 2024-04-17 MED ORDER — TRAMADOL HCL 50 MG PO TABS: 50.0000 mg | ORAL_TABLET | Freq: Three times a day (TID) | ORAL | 2 refills | Status: AC | PRN

## 2024-04-17 NOTE — Telephone Encounter (Signed)
 I sent in tramadol .  Can we see how soon he can get in to see [facility]?  Urgent referral was put in yesterday

## 2024-04-17 NOTE — Telephone Encounter (Signed)
 Jonathan Marsh has this one

## 2024-04-18 NOTE — Telephone Encounter (Signed)
 Betsy faxed the order to ID to [facility] on 09/03 and then I refaxed again today 09/05

## 2024-04-21 NOTE — Telephone Encounter (Signed)
 Based on his medical history and what he is already on, I need to defer to who is managing his current pain

## 2024-04-22 NOTE — Progress Notes (Signed)
 Infectious Diseases Outpatient Consultation Note   Date of Consultation/Follow-up: 04/22/2024 Consultation Requested by: Ozell Celestia Beard* Reason for Consult:  Chief Complaint  Patient presents with  . MSSA bacteremia    Subjective  Jonathan Marsh is a 38 y.o. male who presents for MSSA bacteremia. 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 ID is consulted for antibiotic recommendation for MSSA bacteremia (CLABSI). Hickman catheter has been removed 03/18/24. TEE done 03/20/24 that showed no vegetations or signs of endocarditis. Discharged on IV cefazolin  x 4 weeks, completed as of 04/15/24.  Since completion of therapy, he was seen at outside orthopedic surgery and underwent MRI L spine/pelvis.  Outside read is below, but images are not currently available. Imaging read concerning for sacroiliitis.  After MRI patient was instructed to contact infectious diseases.  MR Pelvis 04/13/24: IMPRESSION:  Markedly abnormal examination with a large heterogeneous soft tissue mass in  the pelvis as above. Further evaluation with CT of the abdomen and pelvis with  and without contrast. Markedly abnormal right iliac wing with marrow replacement. A lesion cannot be  excluded. Other possibility includes sacroiliitis with possibility of an  infected right SI joint. This also can be further evaluated at time of CT.   Orthopedic surgeon:  Ozell Cummins [facility])  History of Present Illness Jonathan Marsh is a 38 year old male who presents with right hip pain and possible infection. He was referred by his orthopedic doctor for evaluation of a possible infection in his right hip.  He has been experiencing right hip pain for several months, which began after a coughing fit. The pain is localized to the back right corner of the hip and has progressively  worsened, especially with activity. Episodes of severe pain render him unable to walk, necessitating rest for two to three days to alleviate inflammation and regain mobility. Physical therapy has exacerbated his symptoms.  The patient reports that his orthopedic doctor told him the MRI showed severe inflammation and possible infection in the hip. He has not experienced fevers, shaking chills, or systemic symptoms suggestive of infection since completing antibiotics. He is currently taking a chemotherapy pill, metonid, which contributes to his fatigue and lethargy.  He has a follow-up appointment on the 12th. He is concerned about the persistent nature of his symptoms and the impact on his mobility.    Objective   Objective There were no vitals filed for this visit. Body mass index is 24.35 kg/m. Physical Exam  Video visit  Results RADIOLOGY Right hip MRI: Severe inflammation and edema with possible infection.  Microbiology <redacted file path>: Susceptibility data from last 90 days. Collected Specimen Info Organism Amikacin Ampicillin Ampicillin + Sulbactam Cefazolin  Ciprofloxacin  Clindamycin Daptomycin Doxycycline Erythromycin Gentamicin High Level Gentamicin Synergy Linezolid Oxacillin  03/17/24 Blood from Other Catheter, PICC Staphylococcus aureus       S  S  S  S    S  S    Staphylococcus epidermidis               03/17/24 Blood from Percutaneous, venous Staphylococcus aureus               03/17/24 Blood from CVC Catheter, Int Jugular (Tunneled) Staphylococcus aureus               02/17/24 Blood from Other Catheter, Midline Klebsiella pneumoniae (Klebsiella pneumoniae complex) (1) S R S S S  S       Enterococcus faecalis   S      S     S  S     Klebsiella pneumoniae (Klebsiella pneumoniae complex) (3) S R R S S     S      Collected Specimen Info Organism Piperacillin + Tazobactam Tobramycin Trimeth/Sulfa Vancomycin  03/17/24 Blood from Other Catheter, PICC Staphylococcus  aureus    S  S    Staphylococcus epidermidis      03/17/24 Blood from Percutaneous, venous Staphylococcus aureus      03/17/24 Blood from CVC Catheter, Int Jugular (Tunneled) Staphylococcus aureus      02/17/24 Blood from Other Catheter, Midline Klebsiella pneumoniae (Klebsiella pneumoniae complex) (1) S S S     Enterococcus faecalis     S    Klebsiella pneumoniae (Klebsiella pneumoniae complex) (3) S S R      Laboratory Studies <redacted file path>: Lab Results  Component Value Date   WBC 4.9 03/24/2024   HGB 9.0 (L) 03/24/2024   PLT 292 03/24/2024   Lab Results  Component Value Date   NA 141 03/24/2024   K 4.4 03/24/2024   CL 111 (H) 03/24/2024   CO2 23 03/24/2024   CO2 25 03/23/2024   BUN 18 03/24/2024   CREATININE 0.9 03/24/2024   CALCIUM 8.6 (L) 03/24/2024   ALKPHOS 185 (H) 03/24/2024   ALT 17 03/24/2024   AST 24 03/24/2024   GLUCOSE 111 03/24/2024   CrCl cannot be calculated (Patient's most recent lab result is older than the maximum 7 days allowed.). - Adjusted Ideal Body Weight CrCl Range (Ideal BW, Actual BW): (129.5, 139) ml/min     EKG (QTc): Lab Results  Component Value Date   QTC 450 03/16/2024   QTC 436 02/04/2024   QTC 469 08/09/2023   Radiology <redacted file path>: Relevant data (if any) personally reviewed in [place]. Interpretation: TEE 03/20/24 without vegetations Pathology <redacted file path>: Relevant data (if any) personally reviewed in [place].  Each new and available test documented above been reviewed at time of consult/follow-up.    Assessment and Plan   Complicated MSSA Bacteremia (03/17/24) Central Line Infection Patient presented due to acute onset fevers, chills, myalgias and fatigue. Found to be febrile, and hypotensive on admission. Imaging unremarkable for abscess or inflammatory cause to suggest a intraabdominal infection. TTE done without signs of endocarditis however a TEE would be more sensitive. Patient was previously hospitalized  02/17/24-02/24/24 for bacteremia (klebsiella and enterococcus) from Hickman catheter where patient receives TPN (catheter was replaced at that time). Blood cultures during this admission growing MSSA (Staph Epi also isolated in 1/3 cultures, mecA negative) . Patient was started empricially on vanc zosyn on admission that has since been narrowed to cefazolin , and patients hickman catheter has been removed 03/18/24. TEE done 03/20/24 that showed no vegetations or signs of endocarditis. Completed 4 weeks of IV cefazolin .   Assessment & Plan MSSA bacteremia status post treatment  - Completed cefazolin  2g IV q8hrs through 04/15/24 (4 week total course)   Right hip pain, concern for R sacroiliitis Chronic right hip pain with episodes of severe inflammation correlating with activity level, persisting for months and predating MSSA bacteremia. MRI shows edema and severe inflammation, with concern for possible infection. Differential includes infection versus non-infectious inflammatory process. Absence of systemic infection signs such as fever or chills and chronicity of symptoms suggest a non-infectious etiology, but infection cannot be ruled out without further investigation. - Obtain MRI  images for review by [facility] radiology team. - Consider joint aspiration for diagnostic purposes if MRI suggests infection in comparison with prior pelvic imaging - Advise emergency room visit if signs of infection such as fever or increased pain and/or difficulty with ambulation occur. - Communicate with orthopedic surgeon Dr. Ozell Pott regarding current assessment and plan. - Follow up with orthopedic appointment on April 25, 2024.  Diagnoses and all orders for this visit:  MSSA bacteremia  Hip pain, chronic, right    This video encounter was conducted with the patient's (or proxy's) verbal consent via secure, interactive audio and video telecommunications while away from clinic/office/hospital.  The patient (or  proxy) was instructed to have this encounter in a suitably private space and to only have persons present to whom they give permission to participate. In addition, patient identity was confirmed by use of name plus two identifiers.  This visit was coded based on medical decision making (MDM).        Future Appointments     Date/Time Provider Department Center Visit Type   04/22/2024 2:00 PM (Arrive by 1:45 PM) Majestic, Dorothyann Chancy, PhD [facility] VIDEO VISIT RETURN   04/23/2024 12:00 PM (Arrive by 11:45 AM) [employer] [facility] LAB   04/23/2024 1:00 PM (Arrive by 12:45 PM) Edwina Lucie Bedford, NP [facility] RETURN VISIT   04/25/2024 2:00 PM [facility]IN [facility] NEW PATIENT   05/05/2024 10:00 AM (Arrive by 9:45 AM) Gwenyth Duwaine Denmark, MD [facility] VIDEO VISIT RETURN   05/06/2024 1:00 PM (Arrive by 12:45 PM) Majestic, Dorothyann Chancy, PhD [facility] VIDEO VISIT RETURN   05/12/2024 11:00 AM (Arrive by 10:45 AM) Mardy Rexene Hun, PA [facility] Palliative Care Outpatient [facility] VIDEO VISIT RETURN   05/20/2024 11:00 AM (Arrive by 10:45 AM) Gwenyth Duwaine Denmark, MD [facility] VIDEO VISIT RETURN   05/20/2024 1:00 PM (Arrive by 12:45 PM) Majestic, Dorothyann Chancy, PhD [facility] VIDEO VISIT RETURN   05/22/2024 8:00 AM Peseski, Prentice Ozell, DO [facility] NEW PATIENT       This note has been created using automated tools and reviewed for accuracy by Ozell Celestia Key.

## 2024-04-25 ENCOUNTER — Ambulatory Visit: Admitting: Orthopaedic Surgery

## 2024-04-30 NOTE — ED Provider Notes (Signed)
 Advanced Practice Provider (APP) Emergency Department AMA/LWBS Disposition Review  ED Visit: 04/29/2024  Record Reviewed: 04/30/2024 8:14 AM.   Abnormal Findings/Reason for Follow-up:  Presented with fever and concern for PICC line infection.   Follow-up:  Wellness Check  ED Resource RN notified to contact patient/guardian regarding abnormal findings.   Jonathan FABIENE Sabot, PA-C Emergency Medicine [facility]  Parts of this note may have been written using Dragon dictation. There may be spelling and/or grammatical errors and phrases that were mistakenly interpreted by the software and missed in my review.     Jonathan Marsh, Jonathan Marsh, Jonathan Marsh 04/30/24 [phone]

## 2024-05-01 ENCOUNTER — Encounter: Payer: Self-pay | Admitting: Family Medicine

## 2024-05-03 NOTE — Nursing Note (Signed)
 All paperwork has been given to [employer] transport. Pt has no questions.

## 2024-05-03 NOTE — Discharge Summary (Signed)
 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   He was treated at [facility] from 8/3 for MSSA bacteremia from CLABSI.  Hickman catheter was removed on 8/5.  TEE on 8/7 was negative for vegetations.  He was discharged on cefazolin  IV for 4 weeks, to be completed on 04/15/2024.  He actually went to [facility] ER on 9/16, left from there due to extended waiting time.   He is admitted with sepsis, from bacteremia and right hip septic arthritis.SABRA  He is afebrile, WBC count is normal.  Procalcitonin elevated at 0.6.  Lactic acid is normal.  Influenza, COVID, RSV are negative.  UA negative for UTI.  CXR revealed left > right streaky bibasilar opacities may reflect atelectasis/aspiration/infection.  Blood cultures from peripheral were negative.  Blood cultures from the PICC line growing gram-negative bacilli.  He is currently on vancomycin and cefepime IV.  ID saw him, removed his left upper extremity PICC line.     MRI of the pelvis on 04/13/2024 revealed-Markedly abnormal right iliac wing with marrow replacement. A lesion cannot be   excluded. Other possibility includes sacroiliitis with possibility of an infected right SI joint . Spoke with ID at [facility] [phone] -Dr. Volney ) who has recommended for Ortho consult if the patient is not getting transferred to [facility] in next 1 to 2 days.  Discussed with orthospine, who has recommended for continuing antibiotics, no surgical intervention required.   Patient continued to have hypotension, likely secondary to high doses of narcotics.  No concern for worsening sepsis.  He is on Neurontin 1200 mg TID, methadone 40+30+40 mg daily, oxycodone  15 mg Q4H PRN .  Patient continued to request for more pain medications.  He does not seem to be in distress during my examination.  Patient's RN also confirmed this, patient comfortably resting in between the medications.  Spoke with his palliative team at [facility], I explained them my observations,             They agreed to continue with the current pain management.   Patient and his wife requested for transfer to [facility].  Called [facility] transfer center twice, finally they accepted him and put him on the waiting list.  He got bed available on 9/20, transfer to [facility].  Post Discharge Follow Up Issues:  Not applicable   Procedures:  none ________________________________________________________________ Discharge Day Services: BP 97/63 (BP Location: Right arm, Patient Position: Lying)   Pulse 75   Temp 98.3 F (36.8 C) (Oral)   Resp 18   Ht 1.994 m (6' 6.5)   Wt 104 kg (229 lb 12.8 oz)   SpO2 97%   BMI 26.22 kg/m  Pt seen on the day of discharge and determined appropriate for discharge.  Physical Exam GEN: NAD, lying in bed EYES: EOMI ENT: MMM CV:  RRR PULM: CTA B ABD: soft, NT/ND, +BS, ileostomy in the left side of the abdomen. EXT: No edema, PICC line in the left upper extremity.    Condition at Discharge: fair  Length of Discharge: I spent 45  mins in the discharge of this  patient. _____________________________________________________________________________ Discharge Medications: Patient Instructions:    Discharge Medications     New Medications      Sig Disp Refill Start End  cefepime (MAXIPIME) 2 g in 100 mL NS Minibag Plus  Infuse 2 g into a venous catheter every 8 (eight) hours.   0     vancomycin 1.5 gram/250 mL IVPB  Infuse 250 mL (1,500 mg total) into a venous catheter every 12 (twelve) hours.   0         Medications To Continue      Sig Disp Refill Start End  alpha lipoic acid 600 mg Cap  Take 1,200 mg by mouth 2 (two) times a day.   0     apixaban 5 mg Tab Commonly known as: ELIQUIS  Take 5 mg by mouth every 12 (twelve) hours.   0     cholecalciferol 1,250 mcg (50,000 unit) capsule Commonly known as: VITAMIN D3  Take 50,000 Units by mouth every 7 days.   0     dutasteride  0.5 mg capsule Commonly known as: AVODART   Take 0.5 mg by mouth daily.   0     gabapentin 400 mg capsule Commonly known as: NEURONTIN  Take 1,200 mg by mouth 3 (three) times a day.   0     imatinib 400 mg chemo tablet Commonly known as: GLEEVEC  Take 400 mg by mouth 2 (two) times a day.   0     ketoconazole  2 % shampoo Commonly known as: NIZORAL   Apply 1 Application topically daily.   0     loperamide 2 mg capsule Commonly known as: IMODIUM  Take 18 mg by mouth 4 (four) times a day as needed.   0     melatonin 10 mg tablet  Dissolve 1 tablet on tongue nightly as needed.   0     methadone 10 mg tablet Commonly known as: DOLOPHINE  40 mg in am, 30 mg in afternoon, 40 in evening   0     methylphenidate 10 mg tablet Commonly known as: RITALIN  Take 10 mg by mouth 2 (two) times a day.   0     naloxone 4 mg/actuation Spry nasal spray Commonly known as: NARCAN  Administer 4 mg into affected nostril(s) as needed for opioid reversal.   0     oxyCODONE  20 mg Tab immediate release tablet Commonly known as: ROXICODONE   Take 50 mg by  mouth every 4 (four) hours as needed.   0     prochlorperazine 10 mg tablet Commonly known as: COMPAZINE  Take 10 mg by mouth every 6 (six) hours as needed.   0     QUEtiapine 100 mg tablet Commonly known as: SEROquel  Take 100 mg by mouth at bedtime.   0         Unreviewed Medications      Sig Disp Refill Start End  oxyCODONE -acetaminophen  5-325 mg per tablet Commonly known as: PERCOCET  Take 1 tablet by mouth every 6 (six) hours as needed.  60 tablet  0          _____________________________________________________________________________ Pending Test Results (if blank, then none): Pending Labs     Order  Current Status   Catheter Culture In process   Blood Culture, 1st Set Preliminary result   Blood Culture, 1st Set Preliminary result   Blood Culture, 2nd Set Preliminary result        Most Recent Labs:  Lab Results  Component Value Date/Time   WBC 2.97 (L) 05/03/2024 0638   RBC 3.40 (L) 05/03/2024 0638   HGB 9.4 (L) 05/03/2024 0638   HCT 27.7 (L) 05/03/2024 0638   PLT 248 05/03/2024 0638    Lab Results  Component Value Date   WBC 2.97 (L) 05/03/2024   HGB 9.4 (L) 05/03/2024   HCT 27.7 (L) 05/03/2024   PLT 248 05/03/2024    Lab Results  Component Value Date   CO2 25 05/03/2024   BUN 8 05/03/2024   GLUCOSE 85 05/03/2024   CREATININE 0.86 05/03/2024   CALCIUM 8.5 (L) 05/03/2024   ALBUMIN 3.8 04/29/2024   AST 17 04/29/2024   ALT 12 04/29/2024    Lab Results  Component Value Date   NA 137 05/03/2024   K 3.5 05/03/2024   CL 106 05/03/2024   CO2 25 05/03/2024   BUN 8 05/03/2024   CREATININE 0.86 05/03/2024   CALCIUM 8.5 (L) 05/03/2024   MG 1.4 (L) 05/03/2024   PHOS 3.6 05/01/2024    Lab Results  Component Value Date   BILITOT 1.5 (H) 04/29/2024   PROT 6.0 (L) 04/29/2024   ALBUMIN 3.8 04/29/2024   ALT 12 04/29/2024   AST 17 04/29/2024    No results found for: PT, INR, APTT Hospital Radiology:  CT Abdomen Pelvis W  Contrast Result Date: 05/01/2024 CT ABDOMEN PELVIS W CONTRAST, 05/01/2024 9:13 AM INDICATION: Acute abdominal pain ADDITIONAL HISTORY: None. COMPARISON: CT 06/14/2015 and CT report 03/17/2024, MRI abdomen report 04/29/2024 TECHNIQUE: CT images of the abdomen and pelvis were obtained after intravenous administration of iodinated contrast. Conventional axial reconstructions and multiplanar reformatted images were submitted for review. FINDINGS: . Lower Chest: Trace bibasilar pleural effusions and bibasilar atelectasis. Mild prominence of the interlobular septae lung bases . Liver: No suspicious focal findings. . Gallbladder/Biliary: Partly collapsed . Spleen: Unremarkable. . Pancreas: No dilatation of main pancreatic duct. 3.7 x 2.9 x 4 cm cystic lesion within head of pancreas containing thin septation . Adrenals: Normal left adrenal gland. Right adrenal adenoma measuring up to 2 cm . Kidneys: Unremarkable. . Peritoneum/Mesenteries/Extraperitoneum: Heterogeneous solid appearing mass within the lower abdominal central pelvic mesentery measuring 11.8 x 9.6 x 12.9 cm (3/459 and 5/40); further soft tissue mass encasing the SMA measures 4.8 x 4.5 x 5.3 cm (3/217 and 5/40). This abuts and displaces the SMV anteriorly. Any hemorrhage within this mass cannot be excluded given the slight hyperdensity, although no active bleeding is seen on this single portal venous phase images. Another soft tissue mass within the anterior mesentery measures 5.4 x 4.5 x 6.5 cm (3/276 and 5/33) Ill-defined stranding and smaller nodular mass lesions within the anterior omental fat. Small volume ascites. No free gas. Small abdominal nodes. Small cystic lesion within the mesentery measuring 2.9 cm (3/3 1 1) . Gastrointestinal tract: No evidence of obstruction. Subtotal colectomy and left ileostomy. . Ureters: Unremarkable. . Bladder: Unremarkable. . Reproductive System: Unremarkable. . Vascular: Soft tissue mass lesions encases the SMA and  branches, branches of the superior mesenteric vein and multiple ileocolic vessels normal aorta. Patent celiac axis, SMA, SMV and portal veins. Narrowing of the portal splenic confluence. . Musculoskeletal: No acute displaced fractures. Polyarticular degenerative changes. No aggressive focal bony  lesions. Anasarca. Post surgical changes within the abdominal wall. Avascular necrosis both femoral heads. Right sacroiliitis. Stranding within the abdominal wall on the right and possible sebaceous cyst on the left side   1.  Multiple soft tissue mass lesions within the mesentery and omentum as described above. These are likely to represent the known desmoid tumors although comparison is limited due to absence of any prior recent imaging 2.  Cystic lesion within the head of pancreas. 3.  Right sacroiliitis and avascular necrosis both femoral heads 4.  Small volume ascites.   XR Chest 1 View Result Date: 04/30/2024 XR CHEST 1 VIEW, 04/29/2024 11:31 PM INDICATION:rule out obvious pneumonia COMPARISON: None. FINDINGS: Supportive devices: None. Cardiovascular/lungs/pleura: Normal cardiomediastinal silhouette. No pneumothorax. No pleural effusion. Left greater than right bibasilar streaky opacities. Other: No acute displaced fractures.   Left greater than right bibasilar streaky opacities may reflect atelectasis versus aspiration/infection in this clinical context.  MRI abdominal outside interpretation Result Date: 04/29/2024  **INTERPRETATION OF OUTSIDE IMAGING** INDICATIONS: R78.81 Bacteremia, B95.61 Methicillin susceptible Staphylococcus aureus infection as the cause of diseases classified elsewhere, M25.551 Pain in right hip, G89.29 Other chronic pain COMPARISONS: MRI abdomen 11/13/2019. CT abdomen pelvis 03/17/2024. TECHNIQUE: - Patient full name: SCHUYLER OLDEN - Date of Original Study: 04/13/2024 - Study: MRI pelvis without contrast - Study Technique: Noncontrast multiplanar, multiecho imaging of the pelvis was  performed, including T1-weighted and fluid sensitive sequences.   FINDINGS: Alignment: No dislocation. Bones: -T2/STIR hyperintense signal along the iliac side of the right SI joint with associated cortical erosive change; this is been seen on multiple recent prior CTs dating back to 02/17/2024, but is new compared to 11/08/2023 CT abdomen and pelvis. -No evidence of acute fracture. - Serpiginous signal within the right greater the left femoral heads favored to represent avascular necrosis. No evidence of articular surface collapse. -No evidence of suspicious osseous lesion. Sacroiliac joints: Trace fluid within the right sacroiliac joint. There is slight bulging of the anterior capsule with mild pericapsular edema. The left sacroiliac joint is grossly intact. Pubic symphysis: Intact. Hip joints: No hip effusions. Muscles: Preserved signal/bulk. Tendons: Intact. Other soft tissues:   Sequences are not optimized for evaluation of the soft tissue structures in the pelvis. Redemonstrated large mass in the pelvis measuring up to 10.7 x 9.0 x 11.7 cm, notably increased in size compared to 03/17/2024 CT abdomen pelvis though differences in modality somewhat complicates evaluation. Postsurgical changes of prior rectal surgery. Left lower quadrant ostomy. Mild free fluid in the inferior pelvis similar to prior CT. Postsurgical changes of the lower anterior abdominal wall. IMPRESSION: 1.  Edema-like signal along the iliac side of the right SI joint with associated cortical erosive changes in conjunction with trace right sacroiliac joint effusion and pericapsular soft tissue edema concerning for infectious sacroiliitis/osteomyelitis. 2.  Avascular necrosis of the right greater than left femoral heads without evidence of articular surface collapse. 3.  Redemonstrated large lower abdominal/pelvic mass compatible with known desmoid tumor, appears minimally increased in size compared to 03/17/2024 CT. Please note: Our interpretation of  studies performed at an outside institution is limited by factors including absence of technical specifics of the image, undisclosed clinical information and the unavailability of the original interpretation.  Specialists at the institution that performed this study may have access to information not available to us  that could make a difference in the interpretation.  We suggest that you obtain the original interpretation from the site where the study was performed. Electronically Reviewed by:  Fairy Pool, MD, [facility] Electronically Reviewed on:  04/29/2024 4:36 PM I have reviewed the images and concur with the above findings. Electronically Signed by:  Rea Parent, MD, [facility] Electronically Signed on:  04/29/2024 5:02 PM  MRI Pelvis WO Contrast Result Date: 04/15/2024 This result has an attachment that is not available. MR PELVIS WITHOUT IV CONTRAST COMPARISON: None. CLINICAL HISTORY: Right hip pain. PULSE SEQUENCES: AX T1, Ax T2 FS, Cor T1, COR STIR & SMALL FOV COR PD FS without contrast. FINDINGS: Examination is markedly abnormal. The most notable finding is a large heterogeneous mass centered in the pelvis measuring 8.3 x 10.9 x 11.4 cm in size. Etiology is uncertain. Further evaluation with CT of the abdomen and pelvis with and without contrast recommended. Malignancy is the primary concern. Markedly abnormal right iliac wing with marrow replacement and edema in the lateral right SI joint. This could be related to an iliac wing lesion with secondary irritation of the right SI joint. This too can be further evaluated with CT. Otherwise, limited evaluation of the pelvic ring and sacrum are unremarkable. There is avascular necrosis in the femoral heads, right greater than left without significant subchondral collapse identified. Musculotendinous structures are unremarkable. There is a subcutaneous cystic process in the medial left thigh of uncertain etiology measuring 2.1 cm in size. This is  only seen on coronal imaging. IMPRESSION: Markedly abnormal examination with a large heterogeneous soft tissue mass in the pelvis as above. Further evaluation with CT of the abdomen and pelvis with and without contrast. Markedly abnormal right iliac wing with marrow replacement. A lesion cannot be excluded. Other possibility includes sacroiliitis with possibility of an infected right SI joint. This also can be further evaluated at time of CT. Incidental note is made of mild avascular necrosis of the femoral heads, right greater than left without subchondral collapse. Electronically signed by: Norleen Satchel MD 04/15/2024 03:46 PM EDT RP Workstation: [id number]  MRI Spine Lumbar WO Contrast Result Date: 04/15/2024 MR LUMBAR SPINE WITHOUT IV CONTRAST COMPARISON: None available CLINICAL HISTORY: Right hip pain. Clinical concern for radiculopathy. TECHNIQUE: SAG T2, SAG T1, SAG STIR, AX T2, AX T1 without IV contrast. FINDINGS: There is normal alignment of the lumbar spine. There is no vertebral body height loss, subluxation or marrow replacing process. The sacrum and SI joints are unremarkable so far as visualized. Conus and cauda equina are unremarkable. T12-L1: There is no focal disc protrusion, foraminal or spinal stenosis. L1-2: There is no focal disc protrusion, foraminal or spinal stenosis. L2-3: There is no focal disc protrusion, foraminal or spinal stenosis. Mild facet arthrosis. L3-4: There is no focal disc protrusion, foraminal or spinal stenosis. Mild facet arthrosis. L4-5: Disc desiccation without focal extrusion, foraminal or spinal stenosis. Mild facet arthrosis. L5-S1: Disc desiccation with mild caudal foraminal narrowing on the left. No high-grade foraminal or spinal stenosis. Mild-to-moderate facet arthrosis on the left. Incidental note is made of a small left adrenal nodule measuring approximately 1.2 x 2.2 cm in size. This is most likely benign. Follow-up CT with adrenal washout protocol recommended  to confirm benign characteristics. IMPRESSION: No focal protrusion, foraminal or spinal stenosis. There is mild facet arthrosis most notably on the left at L5-S1 resulting in mild left foraminal narrowing. No significant right foraminal stenosis. Incidental note is made of a small left adrenal nodule measuring approximately 1.2 x 2.2 cm in size. This is most likely benign. Follow-up CT with adrenal washout protocol recommended to confirm benign characteristics. Electronically signed by:  Norleen Satchel MD 04/15/2024 03:40 PM EDT RP Workstation: [id number]   _____________________________________________________________________________ Discharge Instructions:   Discharge Orders     Full Code     Home Health Skilled Nursing     Details:    Home Health Skilled Nursing Service: Resume Home Health   Lifting Limits:     Details:    Lifting Limits: No lifting limits   Regular diet     Details:    Diet type: Regular  Additional instructions:   This patient is currently using a home medication that needs to be returned to them prior to discharge.        No future appointments.

## 2024-05-07 LAB — CBC AND DIFFERENTIAL
HCT: 28 — AB (ref 41–53)
Hemoglobin: 9 — AB (ref 13.5–17.5)
Platelets: 295 K/uL (ref 150–400)
WBC: 4.2

## 2024-05-07 LAB — COMPREHENSIVE METABOLIC PANEL WITH GFR: Calcium: 8.9 (ref 8.7–10.7)

## 2024-05-07 LAB — BASIC METABOLIC PANEL WITH GFR
BUN: 9 (ref 4–21)
CO2: 25 — AB (ref 13–22)
Chloride: 107 (ref 99–108)
Creatinine: 0.9 (ref 0.6–1.3)
Glucose: 90
Potassium: 4.3 meq/L (ref 3.5–5.1)
Sodium: 139 (ref 137–147)

## 2024-05-07 LAB — HEPATIC FUNCTION PANEL
ALT: 14 U/L (ref 10–40)
AST: 18 (ref 14–40)
Alkaline Phosphatase: 74 (ref 25–125)
Bilirubin, Total: 1.2

## 2024-05-07 LAB — POCT INR: INR: 1.1 (ref 0.80–1.20)

## 2024-05-09 LAB — BASIC METABOLIC PANEL WITH GFR
BUN: 17 (ref 4–21)
CO2: 23 — AB (ref 13–22)
Chloride: 105 (ref 99–108)
Creatinine: 1 (ref 0.6–1.3)
Glucose: 88
Potassium: 3.9 meq/L (ref 3.5–5.1)
Sodium: 138 (ref 137–147)

## 2024-05-09 LAB — COMPREHENSIVE METABOLIC PANEL WITH GFR: Calcium: 8.9 (ref 8.7–10.7)

## 2024-05-09 LAB — HEPATIC FUNCTION PANEL
ALT: 16 U/L (ref 10–40)
AST: 25 (ref 14–40)
Alkaline Phosphatase: 79 (ref 25–125)
Bilirubin, Total: 1.1

## 2024-05-12 ENCOUNTER — Telehealth: Payer: Self-pay

## 2024-05-12 ENCOUNTER — Inpatient Hospital Stay: Admitting: Family Medicine

## 2024-05-12 NOTE — Transitions of Care (Post Inpatient/ED Visit) (Signed)
   05/12/2024  Name: Jonathan Marsh MRN: [id number] DOB: 10/14/1985  Today's TOC FU Call Status: Today's TOC FU Call Status:: Unsuccessful Call (1st Attempt) Unsuccessful Call (1st Attempt) Date: 05/12/24  Attempted to reach the patient regarding the most recent Inpatient/ED visit.  Follow Up Plan: Additional outreach attempts will be made to reach the patient to complete the Transitions of Care (Post Inpatient/ED visit) call.   Alan Ee, RN, BSN, CEN [employer]- Transition of Care Team.  [facility] [phone]

## 2024-05-13 ENCOUNTER — Telehealth: Payer: Self-pay

## 2024-05-13 NOTE — Transitions of Care (Post Inpatient/ED Visit) (Signed)
   05/13/2024  Name: Jonathan Marsh MRN: [id number] DOB: June 22, 1986  Today's TOC FU Call Status: Today's TOC FU Call Status:: Successful TOC FU Call Completed TOC FU Call Complete Date: 05/13/24 Patient's Name and Date of Birth confirmed.  Transition Care Management Follow-up Telephone Call How have you been since you were released from the hospital?: Better Any questions or concerns?: No  Items Reviewed: Did you receive and understand the discharge instructions provided?: Yes (verbally reports that he has his discharge instructions and knows how to managed. Denies indepth review of instructions, medication and follow up. Declines assessments.) Medications obtained,verified, and reconciled?: Partial Review Completed (verbally reports that he has his medications.) Any new allergies since your discharge?: No  Medications Reviewed Today: declines review Medications Reviewed Today   Medications were not reviewed in this encounter      Placed call to patient who reports that he is doing well- just tired.  Declines review of instructions. Encouraged patient to call me if needed. He verbalized understanding. Wished patient a Happy Birthday!  Alan Ee, RN, BSN, CEN [employer]- Transition of Care Team.  [facility] [phone]

## 2024-06-16 ENCOUNTER — Encounter: Payer: Self-pay | Admitting: Radiology

## 2024-07-18 ENCOUNTER — Ambulatory Visit: Admitting: Family Medicine

## 2024-08-08 ENCOUNTER — Other Ambulatory Visit: Payer: Self-pay | Admitting: Family Medicine

## 2024-08-08 DIAGNOSIS — L658 Other specified nonscarring hair loss: Secondary | ICD-10-CM

## 2024-08-25 ENCOUNTER — Encounter: Payer: Self-pay | Admitting: Family Medicine

## 2024-08-28 ENCOUNTER — Telehealth: Payer: Self-pay

## 2024-08-28 NOTE — Transitions of Care (Post Inpatient/ED Visit) (Signed)
" ° °  08/28/2024  Name: Jonathan Marsh MRN: [id number] DOB: [birth year]  Today's TOC FU Call Status: Today's TOC FU Call Status:: Unsuccessful Call (1st Attempt) Unsuccessful Call (1st Attempt) Date: 08/28/24  Attempted to reach the patient regarding the most recent Inpatient/ED visit.  Follow Up Plan: Additional outreach attempts will be made to reach the patient to complete the Transitions of Care (Post Inpatient/ED visit) call.   Alan Ee, RN, BSN, CEN [facility]- Transition of Care Team.  [facility] [phone]  "

## 2024-08-29 ENCOUNTER — Telehealth: Payer: Self-pay

## 2024-08-29 NOTE — Transitions of Care (Post Inpatient/ED Visit) (Signed)
" ° °  08/29/2024  Name: Lelon Ikard MRN: [id number] DOB: [birth year]  Today's TOC FU Call Status: Today's TOC FU Call Status:: Unsuccessful Call (2nd Attempt) Unsuccessful Call (2nd Attempt) Date: 08/29/24  Attempted to reach the patient regarding the most recent Inpatient/ED visit.  Follow Up Plan: Additional outreach attempts will be made to reach the patient to complete the Transitions of Care (Post Inpatient/ED visit) call.   Alan Ee, RN, BSN, CEN [facility]- Transition of Care Team.  [facility] [phone]  "

## 2024-09-01 ENCOUNTER — Telehealth: Payer: Self-pay

## 2024-09-01 NOTE — Transitions of Care (Post Inpatient/ED Visit) (Signed)
" ° °  09/01/2024  Name: Jonathan Marsh MRN: [id number] DOB: Jan 05, 1986  Today's TOC FU Call Status: Today's TOC FU Call Status:: Unsuccessful Call (3rd Attempt) Unsuccessful Call (3rd Attempt) Date: 09/01/24  Attempted to reach the patient regarding the most recent Inpatient/ED visit.  Follow Up Plan: No further outreach attempts will be made at this time. We have been unable to contact the patient.  Alan Ee, RN, BSN, CEN [facility]- Transition of Care Team.  [facility] [phone]  "

## 2024-09-02 ENCOUNTER — Telehealth: Payer: Self-pay | Admitting: Family Medicine

## 2024-09-02 NOTE — Telephone Encounter (Signed)
 Copied from CRM [id number]. Topic: General - Other >> Sep 02, 2024  1:31 PM Mercedes MATSU wrote: Reason for CRM: Patient called in stating that he had a missed call from Robin. Patient is requesting a call back and can be reached at [phone].
